# Patient Record
Sex: Male | Born: 1955
Health system: Southern US, Community
[De-identification: ages and names within clinical notes are randomized; demographics above are authoritative.]

## PROBLEM LIST (undated history)

## (undated) HISTORY — PX: TONSILLECTOMY: SUR1361

## (undated) HISTORY — PX: VASECTOMY: SHX75

## (undated) HISTORY — PX: OTHER SURGICAL HISTORY: SHX169

---

## 2006-08-29 ENCOUNTER — Encounter: Admission: RE | Admit: 2006-08-29 | Discharge: 2006-08-29 | Payer: Self-pay | Admitting: Family Medicine

## 2008-08-30 ENCOUNTER — Encounter: Admission: RE | Admit: 2008-08-30 | Discharge: 2008-08-30 | Payer: Self-pay | Admitting: General Surgery

## 2008-09-16 HISTORY — PX: COLONOSCOPY: SHX174

## 2008-09-16 LAB — HM COLONOSCOPY

## 2009-04-12 ENCOUNTER — Ambulatory Visit: Payer: Self-pay | Admitting: Family Medicine

## 2009-04-12 DIAGNOSIS — R32 Unspecified urinary incontinence: Secondary | ICD-10-CM | POA: Insufficient documentation

## 2010-01-10 ENCOUNTER — Ambulatory Visit: Payer: Self-pay | Admitting: Family Medicine

## 2010-01-10 LAB — CONVERTED CEMR LAB
Nitrite: NEGATIVE
Urobilinogen, UA: 0.2
WBC Urine, dipstick: NEGATIVE

## 2010-01-12 LAB — CONVERTED CEMR LAB
ALT: 13 units/L (ref 0–53)
Alkaline Phosphatase: 61 units/L (ref 39–117)
BUN: 24 mg/dL — ABNORMAL HIGH (ref 6–23)
Basophils Relative: 0.5 % (ref 0.0–3.0)
Calcium: 9.3 mg/dL (ref 8.4–10.5)
Cholesterol: 199 mg/dL (ref 0–200)
Creatinine, Ser: 0.8 mg/dL (ref 0.4–1.5)
Eosinophils Absolute: 0.2 10*3/uL (ref 0.0–0.7)
GFR calc non Af Amer: 106.96 mL/min (ref >60)
Glucose, Bld: 88 mg/dL (ref 70–99)
HCT: 42.1 % (ref 39.0–52.0)
HDL: 37.1 mg/dL — ABNORMAL LOW (ref >39.00)
LDL Cholesterol: 142 mg/dL — ABNORMAL HIGH (ref 0–99)
Lymphs Abs: 2.8 10*3/uL (ref 0.7–4.0)
MCHC: 34.5 g/dL (ref 30.0–36.0)
MCV: 86.1 fL (ref 78.0–100.0)
Monocytes Absolute: 0.7 10*3/uL (ref 0.1–1.0)
Neutro Abs: 5.3 10*3/uL (ref 1.4–7.7)
Potassium: 4.2 meq/L (ref 3.5–5.1)
RDW: 13.2 % (ref 11.5–14.6)
Total Protein: 7.5 g/dL (ref 6.0–8.3)
Triglycerides: 100 mg/dL (ref 0.0–149.0)
VLDL: 20 mg/dL (ref 0.0–40.0)
WBC: 9 10*3/uL (ref 4.5–10.5)

## 2010-01-17 ENCOUNTER — Ambulatory Visit: Payer: Self-pay | Admitting: Family Medicine

## 2010-10-18 NOTE — Assessment & Plan Note (Signed)
Summary: cpx/njr   Vital Signs:  Patient profile:   55 year old male Weight:      163 pounds BMI:     24.16 BP sitting:   124 / 64  (left arm) Cuff size:   regular  Vitals Entered By: Raechel Ache, RN (Jan 17, 2010 2:43 PM) CC: CPX, labs done. C/o LBP x 3 weeks and numbness fingers and toes.   History of Present Illness: 55 yr old male for a cpx. he feels good in general. About 4 weeks ago he twisted his lower back while helping to move a bathtub. he had stiffness and pain in the low back, but this  is going away. No radicular symptoms.   Preventive Screening-Counseling & Management  Alcohol-Tobacco     Smoking Status: current     Packs/Day: 1.0  Allergies: No Known Drug Allergies  Past History:  Past Medical History: Urinary incontinence sees Dr. Campbell Stall for Dermatology exams  Past Surgical History: Reviewed history from 04/12/2009 and no changes required. Tonsillectomy left inguinal hernia repair and umbilical hernia repair per Dr. Johna Sheriff  Vasectomy per Dr. Aldean Ast colonoscopy 2010 per Dr. Elnoria Howard, repeat in 10 yrs  Family History: Reviewed history from 04/12/2009 and no changes required. father died with polycystic kidney disease  Social History: Reviewed history from 04/12/2009 and no changes required. Single Current Smoker Alcohol use-no Drug use-yes Regular exercise-yes Packs/Day:  1.0  Review of Systems  The patient denies anorexia, fever, weight loss, weight gain, vision loss, decreased hearing, hoarseness, chest pain, syncope, dyspnea on exertion, peripheral edema, prolonged cough, headaches, hemoptysis, abdominal pain, melena, hematochezia, severe indigestion/heartburn, hematuria, incontinence, genital sores, muscle weakness, suspicious skin lesions, transient blindness, difficulty walking, depression, unusual weight change, abnormal bleeding, enlarged lymph nodes, angioedema, breast masses, and testicular masses.    Physical Exam  General:   Well-developed,well-nourished,in no acute distress; alert,appropriate and cooperative throughout examination Head:  Normocephalic and atraumatic without obvious abnormalities. No apparent alopecia or balding. Eyes:  No corneal or conjunctival inflammation noted. EOMI. Perrla. Funduscopic exam benign, without hemorrhages, exudates or papilledema. Vision grossly normal. Ears:  External ear exam shows no significant lesions or deformities.  Otoscopic examination reveals clear canals, tympanic membranes are intact bilaterally without bulging, retraction, inflammation or discharge. Hearing is grossly normal bilaterally. Nose:  External nasal examination shows no deformity or inflammation. Nasal mucosa are pink and moist without lesions or exudates. Mouth:  Oral mucosa and oropharynx without lesions or exudates.  Teeth in good repair. Neck:  No deformities, masses, or tenderness noted. Chest Wall:  No deformities, masses, tenderness or gynecomastia noted. Lungs:  Normal respiratory effort, chest expands symmetrically. Lungs are clear to auscultation, no crackles or wheezes. Heart:  Normal rate and regular rhythm. S1 and S2 normal without gallop, murmur, click, rub or other extra sounds. EKG normal Abdomen:  Bowel sounds positive,abdomen soft and non-tender without masses, organomegaly or hernias noted. Rectal:  No external abnormalities noted. Normal sphincter tone. No rectal masses or tenderness. Heme neg.  Genitalia:  Testes bilaterally descended without nodularity, tenderness or masses. No scrotal masses or lesions. No penis lesions or urethral discharge. Prostate:  Prostate gland firm and smooth, no enlargement, nodularity, tenderness, mass, asymmetry or induration. Msk:  No deformity or scoliosis noted of thoracic or lumbar spine.   Pulses:  R and L carotid,radial,femoral,dorsalis pedis and posterior tibial pulses are full and equal bilaterally Extremities:  No clubbing, cyanosis, edema, or deformity  noted with normal full range of motion of  all joints.   Neurologic:  No cranial nerve deficits noted. Station and gait are normal. Plantar reflexes are down-going bilaterally. DTRs are symmetrical throughout. Sensory, motor and coordinative functions appear intact. Skin:  Intact without suspicious lesions or rashes Cervical Nodes:  No lymphadenopathy noted Axillary Nodes:  No palpable lymphadenopathy Inguinal Nodes:  No significant adenopathy Psych:  Cognition and judgment appear intact. Alert and cooperative with normal attention span and concentration. No apparent delusions, illusions, hallucinations   Impression & Recommendations:  Problem # 1:  HEALTH MAINTENANCE EXAM (ICD-V70.0)  Orders: Hemoccult Guaiac-1 spec.(in office) (82270) EKG w/ Interpretation (93000)  Patient Instructions: 1)  Please schedule a follow-up appointment in 1 year.    Preventive Care Screening  Colonoscopy:    Date:  09/16/2008    Results:  normal

## 2010-12-17 ENCOUNTER — Ambulatory Visit (INDEPENDENT_AMBULATORY_CARE_PROVIDER_SITE_OTHER): Payer: PRIVATE HEALTH INSURANCE | Admitting: Family Medicine

## 2010-12-17 ENCOUNTER — Encounter: Payer: Self-pay | Admitting: Family Medicine

## 2010-12-17 VITALS — BP 110/70 | HR 82 | Temp 98.7°F

## 2010-12-17 DIAGNOSIS — S0083XA Contusion of other part of head, initial encounter: Secondary | ICD-10-CM

## 2010-12-17 DIAGNOSIS — R55 Syncope and collapse: Secondary | ICD-10-CM

## 2010-12-17 DIAGNOSIS — S1093XA Contusion of unspecified part of neck, initial encounter: Secondary | ICD-10-CM

## 2010-12-17 DIAGNOSIS — S0003XA Contusion of scalp, initial encounter: Secondary | ICD-10-CM

## 2010-12-18 ENCOUNTER — Encounter: Payer: Self-pay | Admitting: Family Medicine

## 2010-12-18 NOTE — Progress Notes (Signed)
Subjective:    Patient ID: Martin Bowman, male    DOB: 27-Apr-1956, 55 y.o.   MRN: [id number]  HPI Here with his wife to follow up an incident that happened on 01-13-11 [place]. That day he had not eaten any food all day, and he had had very little to drink all day. Then at 4:30 in the afternoon, he smoked a large [place] cigar. He does smoke cigarettes but he never smokes cigars. At one point he stood up and felt very lightheaded. He then passed out and fell forward, striking the floor with his face. This was witnessed by his friends. There was no shaking or clenching. Prior to this he felt fine, no HA or nausea or chest pain or SOB. After he fell he immediately came to again, and talked to his friends. He hit his right cheek and had swelling over the eye and cheek, as well as a few small lacerations on the cheek. He did not seek help. He got back home last night, and his wife had him apply ice to the area. Today he feels fine except for soreness over the cheek and some numbness over the right cheek and the right side of the upper lip.  His vision is normal, no HA, and no neurologic deficits. He has taken some Motrin.    Review of Systems  Constitutional: Negative.   HENT: Positive for facial swelling.   Eyes: Positive for redness. Negative for pain and visual disturbance.  Respiratory: Negative.   Cardiovascular: Negative.   Neurological: Negative.        Objective:   Physical Exam  Constitutional: He is oriented to person, place, and time. He appears well-developed and well-nourished.  HENT:  Nose: Nose normal.  Mouth/Throat: Oropharynx is clear and moist.       There is a lot of swelling over the right cheek, and he is tender here. He is mildly tender over the inferior orbital margin but this is intact. No crepitus. There are several small lacerations over the right cheek that have scabbed over. The cheek is ecchymotic.   Eyes: EOM are  normal. Pupils are equal, round, and reactive to light.       There is a small conjunctival hemorrhage in the right eye. Cornea is clear.   Neck: Normal range of motion. Neck supple.  Cardiovascular: Normal rate, regular rhythm, normal heart sounds and intact distal pulses.  Exam reveals no gallop and no friction rub.   No murmur heard. Pulmonary/Chest: Effort normal and breath sounds normal. No respiratory distress. He has no wheezes. He has no rales. He exhibits no tenderness.  Neurological: He is alert and oriented to person, place, and time. He displays normal reflexes. No cranial nerve deficit. He exhibits normal muscle tone. Coordination normal.          Assessment & Plan:  It seems he had a simple vasovagal episode from a combination of not eating all day, dehydration, and smoking a large cigar. He has had no neurologic symptoms since then. If he develops any such symptoms, he knows to see [place] again for further evaluation. He will apply ice to the cheek area. The lacerations should heal fine. There is no evidence of any fractures. The numbness is due to the swelling and also to some bruising of the inferior orbital nerve on the right side of the face. This should slowly resolve over the next few weeks.  If not he will return.

## 2011-01-23 ENCOUNTER — Other Ambulatory Visit: Payer: Self-pay | Admitting: Family Medicine

## 2011-01-23 ENCOUNTER — Other Ambulatory Visit (INDEPENDENT_AMBULATORY_CARE_PROVIDER_SITE_OTHER): Payer: PRIVATE HEALTH INSURANCE | Admitting: Family Medicine

## 2011-01-23 ENCOUNTER — Other Ambulatory Visit (INDEPENDENT_AMBULATORY_CARE_PROVIDER_SITE_OTHER): Payer: PRIVATE HEALTH INSURANCE

## 2011-01-23 DIAGNOSIS — Z13220 Encounter for screening for lipoid disorders: Secondary | ICD-10-CM

## 2011-01-23 DIAGNOSIS — Z0000 Encounter for general adult medical examination without abnormal findings: Secondary | ICD-10-CM

## 2011-01-23 LAB — CBC WITH DIFFERENTIAL/PLATELET
Eosinophils Absolute: 0.1 10*3/uL (ref 0.0–0.7)
MCHC: 34.2 g/dL (ref 30.0–36.0)
MCV: 87.5 fl (ref 78.0–100.0)
Monocytes Absolute: 0.7 10*3/uL (ref 0.1–1.0)
Neutrophils Relative %: 65.4 % (ref 43.0–77.0)
Platelets: 284 10*3/uL (ref 150.0–400.0)

## 2011-01-23 LAB — BASIC METABOLIC PANEL
BUN: 22 mg/dL (ref 6–23)
GFR: 99.35 mL/min (ref >60.00)
Potassium: 5.1 mEq/L (ref 3.5–5.1)

## 2011-01-23 LAB — URINALYSIS, ROUTINE W REFLEX MICROSCOPIC
Bilirubin Urine: NEGATIVE
Ketones, ur: NEGATIVE
Nitrite: NEGATIVE
Total Protein, Urine: NEGATIVE
pH: 7 (ref 5.0–8.0)

## 2011-01-23 LAB — LDL CHOLESTEROL, DIRECT: Direct LDL: 155.6 mg/dL

## 2011-01-23 LAB — TSH: TSH: 1.92 u[IU]/mL (ref 0.35–5.50)

## 2011-01-23 LAB — LIPID PANEL
Cholesterol: 208 mg/dL — ABNORMAL HIGH (ref 0–200)
Triglycerides: 60 mg/dL (ref 0.0–149.0)

## 2011-01-23 LAB — PSA: PSA: 0.88 ng/mL (ref 0.10–4.00)

## 2011-01-23 LAB — HEPATIC FUNCTION PANEL
Bilirubin, Direct: 0.1 mg/dL (ref 0.0–0.3)
Total Protein: 7 g/dL (ref 6.0–8.3)

## 2011-01-24 NOTE — Progress Notes (Signed)
LMOM to inform Pt. 

## 2011-01-30 ENCOUNTER — Ambulatory Visit (INDEPENDENT_AMBULATORY_CARE_PROVIDER_SITE_OTHER): Payer: PRIVATE HEALTH INSURANCE | Admitting: Family Medicine

## 2011-01-30 ENCOUNTER — Encounter: Payer: Self-pay | Admitting: Family Medicine

## 2011-01-30 VITALS — BP 104/68 | Temp 98.6°F | Ht 68.0 in | Wt 168.0 lb

## 2011-01-30 DIAGNOSIS — Z0000 Encounter for general adult medical examination without abnormal findings: Secondary | ICD-10-CM

## 2011-01-30 NOTE — Progress Notes (Signed)
  Subjective:    Patient ID: Martin Bowman, male    DOB: [birth year], 55 y.o.   MRN: [id number]  HPI 55 yr old male for a cpx. He feels well with no complaints.   Review of Systems  Constitutional: Negative.   HENT: Negative.   Eyes: Negative.   Respiratory: Negative.   Cardiovascular: Negative.   Gastrointestinal: Negative.   Genitourinary: Negative.   Musculoskeletal: Negative.   Skin: Negative.   Neurological: Negative.   Hematological: Negative.   Psychiatric/Behavioral: Negative.        Objective:   Physical Exam  Constitutional: He is oriented to person, place, and time. He appears well-developed and well-nourished. No distress.  HENT:  Head: Normocephalic and atraumatic.  Right Ear: External ear normal.  Left Ear: External ear normal.  Nose: Nose normal.  Mouth/Throat: Oropharynx is clear and moist. No oropharyngeal exudate.  Eyes: Conjunctivae and EOM are normal. Pupils are equal, round, and reactive to light. Right eye exhibits no discharge. Left eye exhibits no discharge. No scleral icterus.  Neck: Neck supple. No JVD present. No tracheal deviation present. No thyromegaly present.  Cardiovascular: Normal rate, regular rhythm, normal heart sounds and intact distal pulses.  Exam reveals no gallop and no friction rub.   No murmur heard. Pulmonary/Chest: Effort normal and breath sounds normal. No respiratory distress. He has no wheezes. He has no rales. He exhibits no tenderness.  Abdominal: Soft. Bowel sounds are normal. He exhibits no distension and no mass. There is no tenderness. There is no rebound and no guarding.  Genitourinary: Rectum normal, prostate normal and penis normal. Guaiac negative stool. No penile tenderness.  Musculoskeletal: Normal range of motion. He exhibits no edema and no tenderness.  Lymphadenopathy:    He has no cervical adenopathy.  Neurological: He is alert and oriented to person, place, and time. He has normal reflexes. No cranial nerve  deficit. He exhibits normal muscle tone. Coordination normal.  Skin: Skin is warm and dry. No [place] noted. He is not diaphoretic. No erythema. No pallor.  Psychiatric: He has a normal mood and affect. His behavior is normal. Judgment and thought content normal.          Assessment & Plan:  He is healthy.

## 2011-06-24 ENCOUNTER — Ambulatory Visit (INDEPENDENT_AMBULATORY_CARE_PROVIDER_SITE_OTHER): Payer: PRIVATE HEALTH INSURANCE | Admitting: Family Medicine

## 2011-06-24 ENCOUNTER — Encounter: Payer: Self-pay | Admitting: Family Medicine

## 2011-06-24 VITALS — BP 122/74 | HR 65 | Temp 98.1°F | Wt 164.0 lb

## 2011-06-24 DIAGNOSIS — M940 Chondrocostal junction syndrome [Tietze]: Secondary | ICD-10-CM

## 2011-06-25 ENCOUNTER — Encounter: Payer: Self-pay | Admitting: Family Medicine

## 2011-06-25 NOTE — Progress Notes (Signed)
  Subjective:    Patient ID: Martin Bowman, male    DOB: 1956/09/09, 55 y.o.   MRN: [id number]  HPI Here for several days of sharp pains in the left chest area. No SOB, but it hurts to take a deep breath. This is not related to exertion. No coughing. No heartburn or nausea or sweats.    Review of Systems  Constitutional: Negative.   Respiratory: Negative.   Cardiovascular: Positive for chest pain. Negative for palpitations and leg swelling.       Objective:   Physical Exam  Constitutional: He appears well-developed and well-nourished.  Neck: No thyromegaly present.  Cardiovascular: Normal rate, regular rhythm, normal heart sounds and intact distal pulses.  Exam reveals no gallop and no friction rub.   No murmur heard. Pulmonary/Chest: Effort normal and breath sounds normal. No respiratory distress. He has no wheezes. He has no rales.       He is tender over left sternal margin   Lymphadenopathy:    He has no cervical adenopathy.          Assessment & Plan:  Try Motrin prn. Recheck prn

## 2013-05-04 ENCOUNTER — Other Ambulatory Visit: Payer: PRIVATE HEALTH INSURANCE

## 2013-05-11 ENCOUNTER — Encounter: Payer: PRIVATE HEALTH INSURANCE | Admitting: Family Medicine

## 2013-06-02 ENCOUNTER — Other Ambulatory Visit (INDEPENDENT_AMBULATORY_CARE_PROVIDER_SITE_OTHER): Payer: BC Managed Care – PPO

## 2013-06-02 DIAGNOSIS — Z0000 Encounter for general adult medical examination without abnormal findings: Secondary | ICD-10-CM

## 2013-06-02 LAB — BASIC METABOLIC PANEL
CO2: 29 mEq/L (ref 19–32)
Calcium: 9.2 mg/dL (ref 8.4–10.5)
Chloride: 105 mEq/L (ref 96–112)
Glucose, Bld: 105 mg/dL — ABNORMAL HIGH (ref 70–99)
Sodium: 138 mEq/L (ref 135–145)

## 2013-06-02 LAB — LIPID PANEL
Cholesterol: 190 mg/dL (ref 0–200)
LDL Cholesterol: 142 mg/dL — ABNORMAL HIGH (ref 0–99)
Triglycerides: 35 mg/dL (ref 0.0–149.0)
VLDL: 7 mg/dL (ref 0.0–40.0)

## 2013-06-02 LAB — CBC WITH DIFFERENTIAL/PLATELET
Basophils Absolute: 0 10*3/uL (ref 0.0–0.1)
Eosinophils Absolute: 0.1 10*3/uL (ref 0.0–0.7)
HCT: 38.3 % — ABNORMAL LOW (ref 39.0–52.0)
Lymphs Abs: 2 10*3/uL (ref 0.7–4.0)
Monocytes Absolute: 0.4 10*3/uL (ref 0.1–1.0)
Neutro Abs: 4.5 10*3/uL (ref 1.4–7.7)
Platelets: 299 10*3/uL (ref 150.0–400.0)
RDW: 13 % (ref 11.5–14.6)
WBC: 7 10*3/uL (ref 4.5–10.5)

## 2013-06-02 LAB — TSH: TSH: 1.3 u[IU]/mL (ref 0.35–5.50)

## 2013-06-02 LAB — POCT URINALYSIS DIPSTICK
Bilirubin, UA: NEGATIVE
Glucose, UA: NEGATIVE
Ketones, UA: NEGATIVE
Spec Grav, UA: 1.025

## 2013-06-02 LAB — HEPATIC FUNCTION PANEL
ALT: 16 U/L (ref 0–53)
Bilirubin, Direct: 0.1 mg/dL (ref 0.0–0.3)
Total Bilirubin: 0.5 mg/dL (ref 0.3–1.2)

## 2013-06-04 NOTE — Progress Notes (Signed)
Quick Note:  Pt has appointment on 06/09/13 will go over then. ______ 

## 2013-06-09 ENCOUNTER — Encounter: Payer: Self-pay | Admitting: Family Medicine

## 2013-06-09 ENCOUNTER — Ambulatory Visit (INDEPENDENT_AMBULATORY_CARE_PROVIDER_SITE_OTHER): Payer: BC Managed Care – PPO | Admitting: Family Medicine

## 2013-06-09 VITALS — BP 136/74 | HR 78 | Temp 98.1°F | Ht 68.0 in | Wt 154.0 lb

## 2013-06-09 DIAGNOSIS — Z23 Encounter for immunization: Secondary | ICD-10-CM

## 2013-06-09 DIAGNOSIS — Z0000 Encounter for general adult medical examination without abnormal findings: Secondary | ICD-10-CM

## 2013-06-09 NOTE — Progress Notes (Signed)
  Subjective:    Patient ID: Martin Bowman, male    DOB: 03-16-1956, 57 y.o.   MRN: [id number]  HPI 57 yr old male for a cpx. He feels well. He is proud to say that he quit smoking 3 weeks ago!    Review of Systems  Constitutional: Negative.   HENT: Negative.   Eyes: Negative.   Respiratory: Negative.   Cardiovascular: Negative.   Gastrointestinal: Negative.   Genitourinary: Negative.   Musculoskeletal: Negative.   Skin: Negative.   Neurological: Negative.   Psychiatric/Behavioral: Negative.        Objective:   Physical Exam  Constitutional: He is oriented to person, place, and time. He appears well-developed and well-nourished. No distress.  HENT:  Head: Normocephalic and atraumatic.  Right Ear: External ear normal.  Left Ear: External ear normal.  Nose: Nose normal.  Mouth/Throat: Oropharynx is clear and moist. No oropharyngeal exudate.  Eyes: Conjunctivae and EOM are normal. Pupils are equal, round, and reactive to light. Right eye exhibits no discharge. Left eye exhibits no discharge. No scleral icterus.  Neck: Neck supple. No JVD present. No tracheal deviation present. No thyromegaly present.  Cardiovascular: Normal rate, regular rhythm, normal heart sounds and intact distal pulses.  Exam reveals no gallop and no friction rub.   No murmur heard. EKG normal   Pulmonary/Chest: Effort normal and breath sounds normal. No respiratory distress. He has no wheezes. He has no rales. He exhibits no tenderness.  Abdominal: Soft. Bowel sounds are normal. He exhibits no distension and no mass. There is no tenderness. There is no rebound and no guarding.  Genitourinary: Rectum normal, prostate normal and penis normal. Guaiac negative stool. No penile tenderness.  Musculoskeletal: Normal range of motion. He exhibits no edema and no tenderness.  Lymphadenopathy:    He has no cervical adenopathy.  Neurological: He is alert and oriented to person, place, and time. He has normal reflexes.  No cranial nerve deficit. He exhibits normal muscle tone. Coordination normal.  Skin: Skin is warm and dry. No Blake noted. He is not diaphoretic. No erythema. No pallor.  Psychiatric: He has a normal mood and affect. His behavior is normal. Judgment and thought content normal.          Assessment & Plan:  Well exam.

## 2014-03-28 ENCOUNTER — Ambulatory Visit (INDEPENDENT_AMBULATORY_CARE_PROVIDER_SITE_OTHER): Payer: BC Managed Care – PPO | Admitting: Family Medicine

## 2014-03-28 ENCOUNTER — Encounter: Payer: Self-pay | Admitting: Family Medicine

## 2014-03-28 VITALS — BP 118/68 | HR 78 | Temp 98.4°F | Ht 68.0 in | Wt 155.0 lb

## 2014-03-28 DIAGNOSIS — J018 Other acute sinusitis: Secondary | ICD-10-CM

## 2014-03-28 MED ORDER — CEFUROXIME AXETIL 500 MG PO TABS: 500.0000 mg | ORAL_TABLET | Freq: Two times a day (BID) | ORAL | Status: DC

## 2014-03-28 NOTE — Progress Notes (Signed)
   Subjective:    Patient ID: Martin Bowman, male    DOB: 1955-12-14, 58 y.o.   MRN: [id number]  HPI Here for 2 weeks of sinus pressure, PND, HA, and a dry cough. No fever. Also 2 days ago he developed a red spot under the right eye.    Review of Systems  Constitutional: Negative.   HENT: Positive for congestion, postnasal drip and sinus pressure.   Eyes: Negative.   Respiratory: Negative.        Objective:   Physical Exam  Constitutional: He appears well-developed and well-nourished.  HENT:  Right Ear: External ear normal.  Left Ear: External ear normal.  Nose: Nose normal.  Mouth/Throat: Oropharynx is clear and moist.  Eyes: Conjunctivae are normal.  Pulmonary/Chest: Effort normal and breath sounds normal.  Lymphadenopathy:    He has no cervical adenopathy.  Skin:  Red wheal on the right cheekbone          Assessment & Plan:  Use Ceftin and use an ice pack on the cheek.

## 2014-03-28 NOTE — Progress Notes (Signed)
Pre visit review using our clinic review tool, if applicable. No additional management support is needed unless otherwise documented below in the visit note. 

## 2014-06-06 ENCOUNTER — Other Ambulatory Visit (INDEPENDENT_AMBULATORY_CARE_PROVIDER_SITE_OTHER): Payer: BC Managed Care – PPO

## 2014-06-06 DIAGNOSIS — Z0000 Encounter for general adult medical examination without abnormal findings: Secondary | ICD-10-CM

## 2014-06-06 LAB — POCT URINALYSIS DIPSTICK
BILIRUBIN UA: NEGATIVE
GLUCOSE UA: NEGATIVE
Ketones, UA: NEGATIVE
Leukocytes, UA: NEGATIVE
Nitrite, UA: NEGATIVE
Protein, UA: NEGATIVE
RBC UA: NEGATIVE
SPEC GRAV UA: 1.015
UROBILINOGEN UA: 0.2
pH, UA: 7.5

## 2014-06-06 LAB — LIPID PANEL
CHOLESTEROL: 201 mg/dL — AB (ref 0–200)
HDL: 41.7 mg/dL (ref >39.00)
LDL CALC: 150 mg/dL — AB (ref 0–99)
NonHDL: 159.3
TRIGLYCERIDES: 45 mg/dL (ref 0.0–149.0)
Total CHOL/HDL Ratio: 5
VLDL: 9 mg/dL (ref 0.0–40.0)

## 2014-06-06 LAB — CBC WITH DIFFERENTIAL/PLATELET
Basophils Absolute: 0 10*3/uL (ref 0.0–0.1)
Basophils Relative: 0.3 % (ref 0.0–3.0)
EOS ABS: 0.1 10*3/uL (ref 0.0–0.7)
Eosinophils Relative: 1.3 % (ref 0.0–5.0)
HCT: 39 % (ref 39.0–52.0)
HEMOGLOBIN: 12.8 g/dL — AB (ref 13.0–17.0)
LYMPHS PCT: 29 % (ref 12.0–46.0)
Lymphs Abs: 2.3 10*3/uL (ref 0.7–4.0)
MCHC: 32.9 g/dL (ref 30.0–36.0)
MCV: 88.2 fl (ref 78.0–100.0)
MONOS PCT: 7 % (ref 3.0–12.0)
Monocytes Absolute: 0.6 10*3/uL (ref 0.1–1.0)
NEUTROS ABS: 5 10*3/uL (ref 1.4–7.7)
Neutrophils Relative %: 62.4 % (ref 43.0–77.0)
PLATELETS: 304 10*3/uL (ref 150.0–400.0)
RBC: 4.42 Mil/uL (ref 4.22–5.81)
RDW: 13.2 % (ref 11.5–15.5)
WBC: 8 10*3/uL (ref 4.0–10.5)

## 2014-06-06 LAB — BASIC METABOLIC PANEL
BUN: 22 mg/dL (ref 6–23)
CALCIUM: 9.4 mg/dL (ref 8.4–10.5)
CO2: 30 mEq/L (ref 19–32)
CREATININE: 0.7 mg/dL (ref 0.4–1.5)
Chloride: 107 mEq/L (ref 96–112)
GFR: 124.87 mL/min (ref >60.00)
Glucose, Bld: 122 mg/dL — ABNORMAL HIGH (ref 70–99)
Potassium: 4.6 mEq/L (ref 3.5–5.1)
SODIUM: 142 meq/L (ref 135–145)

## 2014-06-06 LAB — HEPATIC FUNCTION PANEL
ALBUMIN: 3.9 g/dL (ref 3.5–5.2)
ALK PHOS: 64 U/L (ref 39–117)
ALT: 17 U/L (ref 0–53)
AST: 17 U/L (ref 0–37)
Bilirubin, Direct: 0 mg/dL (ref 0.0–0.3)
TOTAL PROTEIN: 7.2 g/dL (ref 6.0–8.3)
Total Bilirubin: 0.3 mg/dL (ref 0.2–1.2)

## 2014-06-06 LAB — PSA: PSA: 1.19 ng/mL (ref 0.10–4.00)

## 2014-06-06 LAB — TSH: TSH: 1.41 u[IU]/mL (ref 0.35–4.50)

## 2014-06-13 ENCOUNTER — Ambulatory Visit (INDEPENDENT_AMBULATORY_CARE_PROVIDER_SITE_OTHER): Payer: BC Managed Care – PPO | Admitting: Family Medicine

## 2014-06-13 ENCOUNTER — Encounter: Payer: Self-pay | Admitting: Family Medicine

## 2014-06-13 VITALS — BP 135/76 | HR 80 | Temp 98.6°F | Ht 68.0 in | Wt 160.0 lb

## 2014-06-13 DIAGNOSIS — Z0000 Encounter for general adult medical examination without abnormal findings: Secondary | ICD-10-CM

## 2014-06-13 DIAGNOSIS — Z23 Encounter for immunization: Secondary | ICD-10-CM

## 2014-06-13 NOTE — Progress Notes (Signed)
   Subjective:    Patient ID: Martin Bowman, male    DOB: 1956-06-03, 58 y.o.   MRN: [id number]  HPI 58 yr old male for a cpx. He feels well.    Review of Systems  Constitutional: Negative.   HENT: Negative.   Eyes: Negative.   Respiratory: Negative.   Cardiovascular: Negative.   Gastrointestinal: Negative.   Genitourinary: Negative.   Musculoskeletal: Negative.   Skin: Negative.   Neurological: Negative.   Psychiatric/Behavioral: Negative.        Objective:   Physical Exam  Constitutional: He is oriented to person, place, and time. He appears well-developed and well-nourished. No distress.  HENT:  Head: Normocephalic and atraumatic.  Right Ear: External ear normal.  Left Ear: External ear normal.  Nose: Nose normal.  Mouth/Throat: Oropharynx is clear and moist. No oropharyngeal exudate.  Eyes: Conjunctivae and EOM are normal. Pupils are equal, round, and reactive to light. Right eye exhibits no discharge. Left eye exhibits no discharge. No scleral icterus.  Neck: Neck supple. No JVD present. No tracheal deviation present. No thyromegaly present.  Cardiovascular: Normal rate, regular rhythm, normal heart sounds and intact distal pulses.  Exam reveals no gallop and no friction rub.   No murmur heard. EKG normal   Pulmonary/Chest: Effort normal and breath sounds normal. No respiratory distress. He has no wheezes. He has no rales. He exhibits no tenderness.  Abdominal: Soft. Bowel sounds are normal. He exhibits no distension and no mass. There is no tenderness. There is no rebound and no guarding.  Genitourinary: Rectum normal, prostate normal and penis normal. Guaiac negative stool. No penile tenderness.  Musculoskeletal: Normal range of motion. He exhibits no edema and no tenderness.  Lymphadenopathy:    He has no cervical adenopathy.  Neurological: He is alert and oriented to person, place, and time. He has normal reflexes. No cranial nerve deficit. He exhibits normal  muscle tone. Coordination normal.  Skin: Skin is warm and dry. No Mackel noted. He is not diaphoretic. No erythema. No pallor.  Psychiatric: He has a normal mood and affect. His behavior is normal. Judgment and thought content normal.          Assessment & Plan:  Well exam.

## 2014-06-13 NOTE — Progress Notes (Signed)
Pre visit review using our clinic review tool, if applicable. No additional management support is needed unless otherwise documented below in the visit note. 

## 2015-03-29 ENCOUNTER — Telehealth: Payer: Self-pay | Admitting: Family Medicine

## 2015-03-29 ENCOUNTER — Ambulatory Visit (INDEPENDENT_AMBULATORY_CARE_PROVIDER_SITE_OTHER): Payer: BLUE CROSS/BLUE SHIELD | Admitting: Family Medicine

## 2015-03-29 ENCOUNTER — Encounter: Payer: Self-pay | Admitting: Family Medicine

## 2015-03-29 VITALS — BP 126/67 | HR 69 | Temp 98.6°F | Ht 68.0 in | Wt 150.0 lb

## 2015-03-29 DIAGNOSIS — M25562 Pain in left knee: Secondary | ICD-10-CM

## 2015-03-29 DIAGNOSIS — R739 Hyperglycemia, unspecified: Secondary | ICD-10-CM | POA: Diagnosis not present

## 2015-03-29 DIAGNOSIS — R634 Abnormal weight loss: Secondary | ICD-10-CM | POA: Diagnosis not present

## 2015-03-29 MED ORDER — DICLOFENAC SODIUM 75 MG PO TBEC: 75.0000 mg | DELAYED_RELEASE_TABLET | Freq: Two times a day (BID) | ORAL | Status: DC

## 2015-03-29 NOTE — Progress Notes (Signed)
Pre visit review using our clinic review tool, if applicable. No additional management support is needed unless otherwise documented below in the visit note. 

## 2015-03-29 NOTE — Telephone Encounter (Signed)
I spoke with pt and he needs to schedule a lab appointment for Friday at 3:45 pm, he is coming in to have a A1c drawn.

## 2015-03-29 NOTE — Progress Notes (Signed)
   Subjective:    Patient ID: Martin Bowman, male    DOB: February 29, 1956, 59 y.o.   MRN: [id number]  HPI Here for 2 reasons. First he has had intermittent pain and swelling in the left knee for the past 2 weeks. No recent trauma. Advil does not help. No locking or giving way. Second he is concerned about weight loss. He is not trying to lose weight and his food intake is the same as always. However he has lost about 10 lbs in the past few months. He had a normal exam last September with a normal PSA. He had an unremarkable colonoscopy a few years ago. He did have an elevated fasting glucose in Septeber however and we agreed to watch this closely.   Review of Systems  Constitutional: Positive for unexpected weight change. Negative for fever, chills, diaphoresis, activity change, appetite change and fatigue.  HENT: Negative.   Respiratory: Negative.   Cardiovascular: Negative.   Gastrointestinal: Negative.   Endocrine: Negative.   Genitourinary: Negative.        Objective:   Physical Exam  Constitutional: He appears well-developed and well-nourished. No distress.  Cardiovascular: Normal rate, regular rhythm, normal heart sounds and intact distal pulses.   Pulmonary/Chest: Effort normal and breath sounds normal.  Abdominal: Soft. Bowel sounds are normal. He exhibits no distension and no mass. There is no tenderness. There is no rebound and no guarding.  Musculoskeletal:  The left knee is tender along the medial joint space with no swelling or crepitus. No locking or giving way. Full ROM          Assessment & Plan:  Knee pain, possibly due to early arthritis. Try heat and Diclofenac 75 mg bid prn. Two potential sources of weight loss would be lung cancer and diabetes. We will set him up for an A1c soon , and also a CXR. Encouraged him to stop smoking again.

## 2015-03-29 NOTE — Telephone Encounter (Signed)
done

## 2015-03-30 ENCOUNTER — Other Ambulatory Visit: Payer: BLUE CROSS/BLUE SHIELD

## 2015-03-30 ENCOUNTER — Ambulatory Visit (INDEPENDENT_AMBULATORY_CARE_PROVIDER_SITE_OTHER)
Admission: RE | Admit: 2015-03-30 | Discharge: 2015-03-30 | Disposition: A | Discharge status: Home / Self Care | Payer: BLUE CROSS/BLUE SHIELD | Source: Ambulatory Visit | Attending: Family Medicine | Admitting: Family Medicine

## 2015-03-30 DIAGNOSIS — R634 Abnormal weight loss: Secondary | ICD-10-CM | POA: Diagnosis not present

## 2015-03-30 NOTE — Addendum Note (Signed)
Addended by: Bonnye FavaKWEI, Katrena Stehlin K on: 03/30/2015 03:56 PM   Modules accepted: Orders

## 2015-03-31 ENCOUNTER — Other Ambulatory Visit: Payer: BLUE CROSS/BLUE SHIELD

## 2015-03-31 LAB — HEMOGLOBIN A1C: Hgb A1c MFr Bld: 5.8 % (ref 4.6–6.5)

## 2015-04-03 ENCOUNTER — Telehealth: Payer: Self-pay | Admitting: Family Medicine

## 2015-04-03 NOTE — Telephone Encounter (Signed)
I spoke with pt and went over results. 

## 2015-04-03 NOTE — Telephone Encounter (Signed)
Patient would like to know the results to his labs and x-ray.

## 2015-12-27 ENCOUNTER — Ambulatory Visit (INDEPENDENT_AMBULATORY_CARE_PROVIDER_SITE_OTHER): Payer: BLUE CROSS/BLUE SHIELD | Admitting: Family Medicine

## 2015-12-27 DIAGNOSIS — R21 Rash and other nonspecific skin eruption: Secondary | ICD-10-CM | POA: Diagnosis not present

## 2015-12-27 MED ORDER — TRIAMCINOLONE ACETONIDE 0.1 % EX CREA: 1.0000 "application " | TOPICAL_CREAM | Freq: Two times a day (BID) | CUTANEOUS | Status: DC | PRN

## 2015-12-27 MED ORDER — PREDNISONE 10 MG PO TABS: ORAL_TABLET | ORAL | Status: DC

## 2015-12-27 NOTE — Patient Instructions (Signed)
Try to avoid scratching [place] as much as possible.   Touch base in 2 weeks if no better.

## 2015-12-27 NOTE — Progress Notes (Signed)
   Subjective:    Patient ID: Martin Bowman, male    DOB: Feb 23, 1956, 60 y.o.   MRN: [id number]  HPI  Acute visit for Falzon  upper back  onset about 6 weeks ago.  Patient was taking high-dose B12 supplement with 3000 g once daily.  He was concerned this may have triggered.  Denies any other skin [place]. His Raglin is very symmetric and pruritic.  Bilateral involvement. Nonpainful.  Tried over-the-counter Neosporin without improvement.  Past Medical History  Diagnosis Date  . Urinary incontinence    Past Surgical History  Procedure Laterality Date  . Tonsillectomy    . Left inguinal hernia repair and umbilical hernia repiar      Dr. Johna Sheriffhoxworth  . Vasectomy      Dr. Aldean AstKimbrough  . Colonoscopy  09-16-08    not sure who (maybe Dr. Elnoria HowardHung?), clear, repeat in 10 yrs     reports that he has been smoking Cigarettes.  He has a 40 pack-year smoking history. He has never used smokeless tobacco. He reports that he does not drink alcohol or use illicit drugs. family history is not on file. No Known Allergies    Review of Systems  Constitutional: Negative for fever and chills.  Skin: Positive for [place].  Hematological: Negative for adenopathy.       Objective:   Physical Exam  Constitutional: He appears well-developed and well-nourished.  Cardiovascular: Normal rate and regular rhythm.   Pulmonary/Chest: Effort normal and breath sounds normal. No respiratory distress. He has no wheezes. He has no rales.  Skin: [place] noted.  Patient has diffuse Koe[place]ne across his upper back very symmetric. He has erythematous base -somewhat lichenified and excoriated in several places. No pustules. No vesicles. Nontender.          Assessment & Plan:   Lichenified and excoriated skin Skeet upper back. Etiology unclear. Avoid scratching. Prednisone taper over the next 8 days. Triamcinolone 0.1% cream twice daily. Follow-up with primary in 2 weeks if not improving. He has discontinued the B12. Consider course of  doxycycline if not clearing with the above, though he has no pustular quality at this time

## 2015-12-27 NOTE — Progress Notes (Signed)
Pre visit review using our clinic review tool, if applicable. No additional management support is needed unless otherwise documented below in the visit note. 

## 2016-01-20 IMAGING — CR DG CHEST 2V
2 series · 2 of 2 positions shown · non-contrast
Comparison: None.

CLINICAL DATA: 15 lb weight loss.  Smoker.

EXAM:
CHEST  2 VIEW

[view not recorded (1 of 2)]
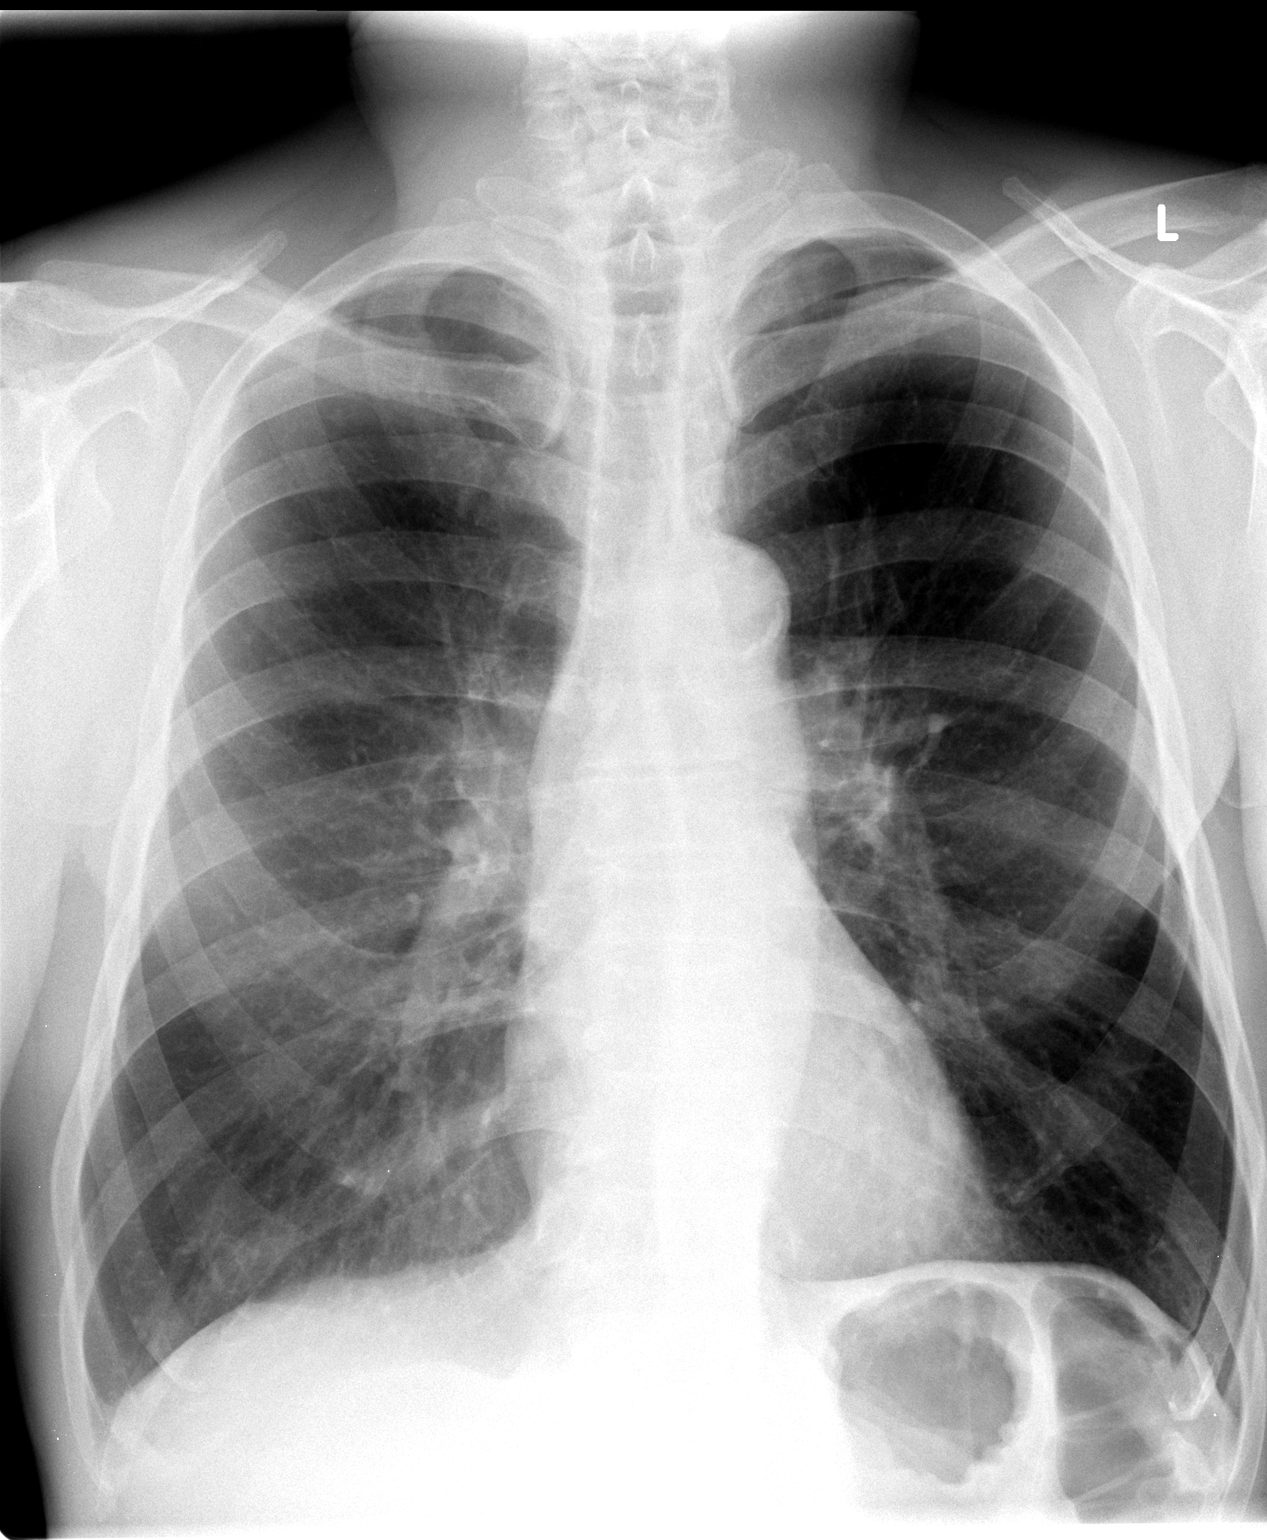

[view not recorded (2 of 2)]
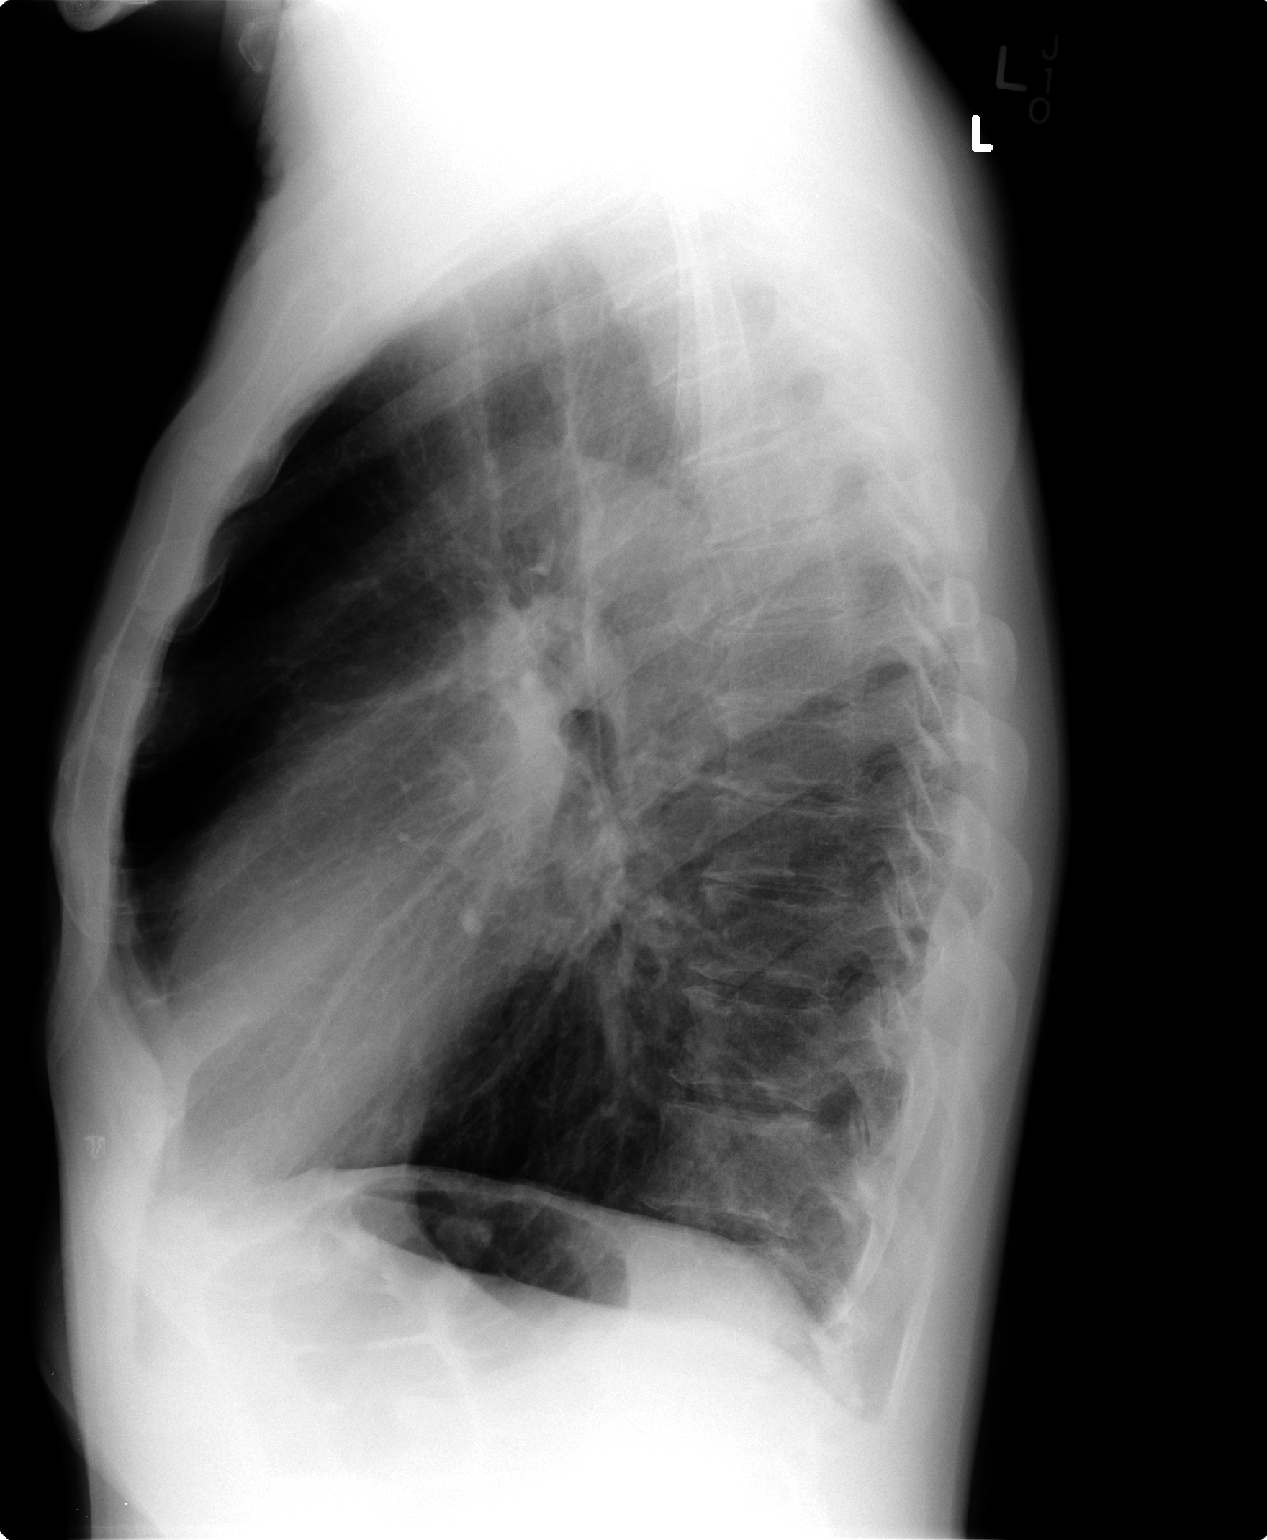

[2 of 2 positions shown; findings below may reference images not displayed]

FINDINGS: Mediastinum and hilar structures are normal. Mild right lower lobe
infiltrate cannot be excluded. Heart size normal. No pleural
effusion or pneumothorax. No acute bony abnormality.
IMPRESSION: Mild right lower lobe infiltrate cannot be excluded.

## 2016-06-05 ENCOUNTER — Ambulatory Visit (INDEPENDENT_AMBULATORY_CARE_PROVIDER_SITE_OTHER): Payer: BLUE CROSS/BLUE SHIELD | Admitting: Family Medicine

## 2016-06-05 ENCOUNTER — Encounter: Payer: Self-pay | Admitting: Family Medicine

## 2016-06-05 VITALS — BP 132/68 | HR 78 | Temp 98.1°F | Ht 68.0 in | Wt 152.0 lb

## 2016-06-05 DIAGNOSIS — R739 Hyperglycemia, unspecified: Secondary | ICD-10-CM

## 2016-06-05 DIAGNOSIS — L989 Disorder of the skin and subcutaneous tissue, unspecified: Secondary | ICD-10-CM

## 2016-06-05 DIAGNOSIS — H6982 Other specified disorders of Eustachian tube, left ear: Secondary | ICD-10-CM | POA: Diagnosis not present

## 2016-06-05 DIAGNOSIS — Z209 Contact with and (suspected) exposure to unspecified communicable disease: Secondary | ICD-10-CM | POA: Diagnosis not present

## 2016-06-05 DIAGNOSIS — R202 Paresthesia of skin: Secondary | ICD-10-CM

## 2016-06-05 NOTE — Progress Notes (Signed)
   Subjective:    Patient ID: Martin Bowman, male    DOB: April 19, 1956,Jana Half 60 y.o.   MRN: [id number]  HPI Here for several issues. First for 2 weeks he has had muffled hearing and pain in the left ear. No sinus pressure or ST or fever. Also for 2 months he has had an irritated lesion over the upper chest. This has not changed in appearance. Finally he mentions several months of intermittent numbness and tingling in both hands and fingers, not the feet. Of note he had an elevated fasting glucose of 122 last year with a normal A1c.    Review of Systems  Constitutional: Negative.   HENT: Positive for ear pain, hearing loss and sneezing. Negative for congestion, postnasal drip, sinus pressure and sore throat.   Eyes: Negative.   Respiratory: Negative.   Skin: Positive for color change.  Neurological: Positive for numbness. Negative for weakness.       Objective:   Physical Exam  Constitutional: He appears well-developed and well-nourished.  HENT:  Right Ear: External ear normal.  Left Ear: External ear normal.  Nose: Nose normal.  Mouth/Throat: Oropharynx is clear and moist.  Eyes: Conjunctivae are normal.  Neck: No thyromegaly present.  Cardiovascular: Normal rate, regular rhythm, normal heart sounds and intact distal pulses.   Pulmonary/Chest: Effort normal and breath sounds normal.  Lymphadenopathy:    He has no cervical adenopathy.  Skin:  The upper sternal area has a papular red scaly lesion           Assessment & Plan:  For the eustachian tube dysfunction, try Zyrtec D. The numbness in his hands may be due to  Neuropathy so we will add an A1c to his cpx labs next month. For the skin lesion we will refer him to Dr. Danella DeisGruber, his dermatologist.  Nelwyn SalisburyFRY,Meghann Landing A, MD

## 2016-06-05 NOTE — Progress Notes (Signed)
Pre visit review using our clinic review tool, if applicable. No additional management support is needed unless otherwise documented below in the visit note. 

## 2016-06-27 DIAGNOSIS — Z23 Encounter for immunization: Secondary | ICD-10-CM | POA: Diagnosis not present

## 2016-07-01 DIAGNOSIS — C44519 Basal cell carcinoma of skin of other part of trunk: Secondary | ICD-10-CM | POA: Diagnosis not present

## 2016-07-01 DIAGNOSIS — L570 Actinic keratosis: Secondary | ICD-10-CM | POA: Diagnosis not present

## 2016-07-08 ENCOUNTER — Other Ambulatory Visit (INDEPENDENT_AMBULATORY_CARE_PROVIDER_SITE_OTHER): Payer: BLUE CROSS/BLUE SHIELD

## 2016-07-08 DIAGNOSIS — R739 Hyperglycemia, unspecified: Secondary | ICD-10-CM | POA: Diagnosis not present

## 2016-07-08 DIAGNOSIS — Z209 Contact with and (suspected) exposure to unspecified communicable disease: Secondary | ICD-10-CM | POA: Diagnosis not present

## 2016-07-08 DIAGNOSIS — Z0000 Encounter for general adult medical examination without abnormal findings: Secondary | ICD-10-CM

## 2016-07-08 LAB — POC URINALSYSI DIPSTICK (AUTOMATED)
Bilirubin, UA: NEGATIVE
Glucose, UA: NEGATIVE
Ketones, UA: NEGATIVE
Leukocytes, UA: NEGATIVE
Nitrite, UA: NEGATIVE
PROTEIN UA: NEGATIVE
SPEC GRAV UA: 1.015
UROBILINOGEN UA: 0.2
pH, UA: 6

## 2016-07-08 LAB — BASIC METABOLIC PANEL
BUN: 22 mg/dL (ref 6–23)
CALCIUM: 9.2 mg/dL (ref 8.4–10.5)
CO2: 28 meq/L (ref 19–32)
Chloride: 106 mEq/L (ref 96–112)
Creatinine, Ser: 0.8 mg/dL (ref 0.40–1.50)
GFR: 104.53 mL/min (ref >60.00)
GLUCOSE: 105 mg/dL — AB (ref 70–99)
Potassium: 4.7 mEq/L (ref 3.5–5.1)
Sodium: 140 mEq/L (ref 135–145)

## 2016-07-08 LAB — LIPID PANEL
CHOL/HDL RATIO: 5
Cholesterol: 194 mg/dL (ref 0–200)
HDL: 41.2 mg/dL (ref >39.00)
LDL Cholesterol: 144 mg/dL — ABNORMAL HIGH (ref 0–99)
NONHDL: 153.12
Triglycerides: 46 mg/dL (ref 0.0–149.0)
VLDL: 9.2 mg/dL (ref 0.0–40.0)

## 2016-07-08 LAB — CBC WITH DIFFERENTIAL/PLATELET
BASOS ABS: 0 10*3/uL (ref 0.0–0.1)
BASOS PCT: 0.3 % (ref 0.0–3.0)
EOS ABS: 0.1 10*3/uL (ref 0.0–0.7)
Eosinophils Relative: 0.9 % (ref 0.0–5.0)
HEMATOCRIT: 38.5 % — AB (ref 39.0–52.0)
Hemoglobin: 12.9 g/dL — ABNORMAL LOW (ref 13.0–17.0)
Lymphocytes Relative: 21.6 % (ref 12.0–46.0)
Lymphs Abs: 1.9 10*3/uL (ref 0.7–4.0)
MCHC: 33.4 g/dL (ref 30.0–36.0)
MCV: 86.1 fl (ref 78.0–100.0)
Monocytes Absolute: 0.7 10*3/uL (ref 0.1–1.0)
Monocytes Relative: 7.4 % (ref 3.0–12.0)
Neutro Abs: 6.2 10*3/uL (ref 1.4–7.7)
Neutrophils Relative %: 69.8 % (ref 43.0–77.0)
Platelets: 320 10*3/uL (ref 150.0–400.0)
RBC: 4.47 Mil/uL (ref 4.22–5.81)
RDW: 13.5 % (ref 11.5–15.5)
WBC: 8.9 10*3/uL (ref 4.0–10.5)

## 2016-07-08 LAB — HEPATIC FUNCTION PANEL
ALK PHOS: 62 U/L (ref 39–117)
ALT: 11 U/L (ref 0–53)
AST: 12 U/L (ref 0–37)
Albumin: 4.2 g/dL (ref 3.5–5.2)
BILIRUBIN DIRECT: 0.1 mg/dL (ref 0.0–0.3)
TOTAL PROTEIN: 6.9 g/dL (ref 6.0–8.3)
Total Bilirubin: 0.3 mg/dL (ref 0.2–1.2)

## 2016-07-08 LAB — HEPATITIS C ANTIBODY: HCV Ab: NEGATIVE

## 2016-07-08 LAB — PSA: PSA: 0.7 ng/mL (ref 0.10–4.00)

## 2016-07-08 LAB — HEMOGLOBIN A1C: Hgb A1c MFr Bld: 5.7 % (ref 4.6–6.5)

## 2016-07-08 LAB — TSH: TSH: 1.53 u[IU]/mL (ref 0.35–4.50)

## 2016-07-15 ENCOUNTER — Ambulatory Visit (INDEPENDENT_AMBULATORY_CARE_PROVIDER_SITE_OTHER): Payer: BLUE CROSS/BLUE SHIELD | Admitting: Family Medicine

## 2016-07-15 ENCOUNTER — Encounter: Payer: Self-pay | Admitting: Family Medicine

## 2016-07-15 VITALS — BP 145/75 | HR 89 | Temp 98.1°F | Ht 68.0 in | Wt 156.0 lb

## 2016-07-15 DIAGNOSIS — Z0000 Encounter for general adult medical examination without abnormal findings: Secondary | ICD-10-CM

## 2016-07-15 NOTE — Progress Notes (Signed)
   Subjective:    Patient ID: Martin Bowman, male    DOB: February 26, 1956, 60 y.o.   MRN: [id number]  HPI 60 yr old male for a well exam. He feels fine.    Review of Systems  Constitutional: Negative.   HENT: Negative.   Eyes: Negative.   Respiratory: Negative.   Cardiovascular: Negative.   Gastrointestinal: Negative.   Genitourinary: Negative.   Musculoskeletal: Negative.   Skin: Negative.   Neurological: Negative.   Psychiatric/Behavioral: Negative.        Objective:   Physical Exam  Constitutional: He is oriented to person, place, and time. He appears well-developed and well-nourished. No distress.  HENT:  Head: Normocephalic and atraumatic.  Right Ear: External ear normal.  Left Ear: External ear normal.  Nose: Nose normal.  Mouth/Throat: Oropharynx is clear and moist. No oropharyngeal exudate.  Eyes: Conjunctivae and EOM are normal. Pupils are equal, round, and reactive to light. Right eye exhibits no discharge. Left eye exhibits no discharge. No scleral icterus.  Neck: Neck supple. No JVD present. No tracheal deviation present. No thyromegaly present.  Cardiovascular: Normal rate, regular rhythm, normal heart sounds and intact distal pulses.  Exam reveals no gallop and no friction rub.   No murmur heard. EKG shows stable LAFB  Pulmonary/Chest: Effort normal and breath sounds normal. No respiratory distress. He has no wheezes. He has no rales. He exhibits no tenderness.  Abdominal: Soft. Bowel sounds are normal. He exhibits no distension and no mass. There is no tenderness. There is no rebound and no guarding.  Genitourinary: Rectum normal, prostate normal and penis normal. Rectal exam shows guaiac negative stool. No penile tenderness.  Musculoskeletal: Normal range of motion. He exhibits no edema or tenderness.  Lymphadenopathy:    He has no cervical adenopathy.  Neurological: He is alert and oriented to person, place, and time. He has normal reflexes. No cranial nerve  deficit. He exhibits normal muscle tone. Coordination normal.  Skin: Skin is warm and dry. No Cardon noted. He is not diaphoretic. No erythema. No pallor.  Psychiatric: He has a normal mood and affect. His behavior is normal. Judgment and thought content normal.          Assessment & Plan:  Well exam. We discussed diet and exercise.  Nelwyn SalisburyFRY,[name] A, MD

## 2016-07-15 NOTE — Progress Notes (Signed)
Pre visit review using our clinic review tool, if applicable. No additional management support is needed unless otherwise documented below in the visit note. 

## 2017-01-06 DIAGNOSIS — L814 Other melanin hyperpigmentation: Secondary | ICD-10-CM | POA: Diagnosis not present

## 2017-01-06 DIAGNOSIS — D1801 Hemangioma of skin and subcutaneous tissue: Secondary | ICD-10-CM | POA: Diagnosis not present

## 2017-01-06 DIAGNOSIS — L821 Other seborrheic keratosis: Secondary | ICD-10-CM | POA: Diagnosis not present

## 2017-01-06 DIAGNOSIS — Z86018 Personal history of other benign neoplasm: Secondary | ICD-10-CM | POA: Diagnosis not present

## 2017-02-07 ENCOUNTER — Ambulatory Visit (INDEPENDENT_AMBULATORY_CARE_PROVIDER_SITE_OTHER): Payer: BLUE CROSS/BLUE SHIELD | Admitting: Family Medicine

## 2017-02-07 VITALS — BP 155/80 | HR 75 | Temp 98.4°F | Ht 68.0 in | Wt 157.0 lb

## 2017-02-07 DIAGNOSIS — M7022 Olecranon bursitis, left elbow: Secondary | ICD-10-CM | POA: Diagnosis not present

## 2017-02-07 NOTE — Patient Instructions (Signed)
WE NOW OFFER   [facility] FAST TRACK!!!  SAME DAY Appointments for ACUTE CARE  Such as: Sprains, Injuries, cuts, abrasions, rashes, muscle pain, joint pain, back pain Colds, flu, sore throats, headache, allergies, cough, fever  Ear pain, sinus and eye infections Abdominal pain, nausea, vomiting, diarrhea, upset stomach Animal/insect bites  3 Easy Ways to Schedule: Walk-In Scheduling Call in scheduling Mychart Sign-up: [email or web address]         

## 2017-02-10 ENCOUNTER — Encounter: Payer: Self-pay | Admitting: Family Medicine

## 2017-02-10 NOTE — Progress Notes (Signed)
   Subjective:    Patient ID: Martin Bowman, male    DOB: 16-Apr-1956, 61 y.o.   MRN: [id number]  HPI Here for 2 days of swelling in the left elbow. No pain, no recent trauma.   Review of Systems  Constitutional: Negative.   Musculoskeletal: Positive for arthralgias and joint swelling.       Objective:   Physical Exam  Constitutional: He appears well-developed and well-nourished.  Cardiovascular: Normal rate, regular rhythm, normal heart sounds and intact distal pulses.   Pulmonary/Chest: Effort normal and breath sounds normal.  Musculoskeletal:  The left olecranon bursa is swollen but not red or warm or tender          Assessment & Plan:  Olecranon bursitis, treat with rest, ice packs, and an ACE wrap.  Gershon CraneStephen Fronia Depass, MD

## 2017-08-06 ENCOUNTER — Ambulatory Visit (INDEPENDENT_AMBULATORY_CARE_PROVIDER_SITE_OTHER): Payer: BLUE CROSS/BLUE SHIELD | Admitting: Family Medicine

## 2017-08-06 ENCOUNTER — Encounter: Payer: Self-pay | Admitting: Family Medicine

## 2017-08-06 VITALS — BP 122/80 | HR 65 | Temp 97.8°F | Ht 68.0 in | Wt 149.8 lb

## 2017-08-06 DIAGNOSIS — Z0000 Encounter for general adult medical examination without abnormal findings: Secondary | ICD-10-CM | POA: Diagnosis not present

## 2017-08-06 DIAGNOSIS — Z23 Encounter for immunization: Secondary | ICD-10-CM

## 2017-08-06 LAB — CBC WITH DIFFERENTIAL/PLATELET
BASOS PCT: 0.3 % (ref 0.0–3.0)
Basophils Absolute: 0 10*3/uL (ref 0.0–0.1)
EOS PCT: 1.1 % (ref 0.0–5.0)
Eosinophils Absolute: 0.1 10*3/uL (ref 0.0–0.7)
HEMATOCRIT: 41.7 % (ref 39.0–52.0)
HEMOGLOBIN: 13.8 g/dL (ref 13.0–17.0)
LYMPHS PCT: 22.3 % (ref 12.0–46.0)
Lymphs Abs: 1.6 10*3/uL (ref 0.7–4.0)
MCHC: 33 g/dL (ref 30.0–36.0)
MCV: 87.5 fl (ref 78.0–100.0)
Monocytes Absolute: 0.5 10*3/uL (ref 0.1–1.0)
Monocytes Relative: 7.6 % (ref 3.0–12.0)
Neutro Abs: 4.9 10*3/uL (ref 1.4–7.7)
Neutrophils Relative %: 68.7 % (ref 43.0–77.0)
Platelets: 299 10*3/uL (ref 150.0–400.0)
RBC: 4.77 Mil/uL (ref 4.22–5.81)
RDW: 12.9 % (ref 11.5–15.5)
WBC: 7.1 10*3/uL (ref 4.0–10.5)

## 2017-08-06 LAB — POC URINALSYSI DIPSTICK (AUTOMATED)
Bilirubin, UA: NEGATIVE
Glucose, UA: NEGATIVE
Ketones, UA: NEGATIVE
Leukocytes, UA: NEGATIVE
NITRITE UA: NEGATIVE
PH UA: 6 (ref 5.0–8.0)
Protein, UA: NEGATIVE
Urobilinogen, UA: 0.2 E.U./dL

## 2017-08-06 LAB — LIPID PANEL
Cholesterol: 194 mg/dL (ref 0–200)
HDL: 33.8 mg/dL — ABNORMAL LOW (ref >39.00)
LDL CALC: 149 mg/dL — AB (ref 0–99)
NonHDL: 160.63
TRIGLYCERIDES: 56 mg/dL (ref 0.0–149.0)
Total CHOL/HDL Ratio: 6
VLDL: 11.2 mg/dL (ref 0.0–40.0)

## 2017-08-06 LAB — TSH: TSH: 0.74 u[IU]/mL (ref 0.35–4.50)

## 2017-08-06 LAB — HEPATIC FUNCTION PANEL
ALT: 11 U/L (ref 0–53)
AST: 14 U/L (ref 0–37)
Albumin: 4.2 g/dL (ref 3.5–5.2)
Alkaline Phosphatase: 66 U/L (ref 39–117)
Bilirubin, Direct: 0.1 mg/dL (ref 0.0–0.3)
Total Bilirubin: 0.5 mg/dL (ref 0.2–1.2)
Total Protein: 6.8 g/dL (ref 6.0–8.3)

## 2017-08-06 LAB — BASIC METABOLIC PANEL
BUN: 24 mg/dL — ABNORMAL HIGH (ref 6–23)
CALCIUM: 9.3 mg/dL (ref 8.4–10.5)
CO2: 30 mEq/L (ref 19–32)
Chloride: 106 mEq/L (ref 96–112)
Creatinine, Ser: 0.7 mg/dL (ref 0.40–1.50)
GFR: 121.51 mL/min (ref >60.00)
Glucose, Bld: 100 mg/dL — ABNORMAL HIGH (ref 70–99)
POTASSIUM: 4.9 meq/L (ref 3.5–5.1)
Sodium: 141 mEq/L (ref 135–145)

## 2017-08-06 LAB — PSA: PSA: 0.83 ng/mL (ref 0.10–4.00)

## 2017-08-06 NOTE — Progress Notes (Signed)
   Subjective:    Patient ID: Martin Bowman, male    DOB: 05-Dec-1955, 61 y.o.   MRN: [id number]  HPI Here for a well exam. He feels well.    Review of Systems  Constitutional: Negative.   HENT: Negative.   Eyes: Negative.   Respiratory: Negative.   Cardiovascular: Negative.   Gastrointestinal: Negative.   Genitourinary: Negative.   Musculoskeletal: Negative.   Skin: Negative.   Neurological: Negative.   Psychiatric/Behavioral: Negative.        Objective:   Physical Exam  Constitutional: He is oriented to person, place, and time. He appears well-developed and well-nourished. No distress.  HENT:  Head: Normocephalic and atraumatic.  Right Ear: External ear normal.  Left Ear: External ear normal.  Nose: Nose normal.  Mouth/Throat: Oropharynx is clear and moist. No oropharyngeal exudate.  Eyes: Conjunctivae and EOM are normal. Pupils are equal, round, and reactive to light. Right eye exhibits no discharge. Left eye exhibits no discharge. No scleral icterus.  Neck: Neck supple. No JVD present. No tracheal deviation present. No thyromegaly present.  Cardiovascular: Normal rate, regular rhythm, normal heart sounds and intact distal pulses. Exam reveals no gallop and no friction rub.  No murmur heard. Pulmonary/Chest: Effort normal and breath sounds normal. No respiratory distress. He has no wheezes. He has no rales. He exhibits no tenderness.  Abdominal: Soft. Bowel sounds are normal. He exhibits no distension and no mass. There is no tenderness. There is no rebound and no guarding.  Genitourinary: Rectum normal, prostate normal and penis normal. Rectal exam shows guaiac negative stool. No penile tenderness.  Musculoskeletal: Normal range of motion. He exhibits no edema or tenderness.  Lymphadenopathy:    He has no cervical adenopathy.  Neurological: He is alert and oriented to person, place, and time. He has normal reflexes. No cranial nerve deficit. He exhibits normal muscle  tone. Coordination normal.  Skin: Skin is warm and dry. No Lau noted. He is not diaphoretic. No erythema. No pallor.  Psychiatric: He has a normal mood and affect. His behavior is normal. Judgment and thought content normal.          Assessment & Plan:  Well exam. We discussed diet and exercise. Get fasting labs.  Gershon CraneStephen Fry, MD

## 2017-09-01 ENCOUNTER — Telehealth: Payer: Self-pay | Admitting: Family Medicine

## 2017-09-01 NOTE — Telephone Encounter (Signed)
Copied from CRM [id number]. Topic: Inquiry >> Sep 01, 2017  4:12 PM Windy KalataMichael, Taylor L, NT wrote: Reason for CRM: patient states he had a physical done at the end of November and is upset because he has not heard back from his doctor about his lab results. Please advise.  Can you forward again to provider? Looks like no response yet?

## 2017-09-01 NOTE — Telephone Encounter (Signed)
Sent to PCP to review pt's lab results send message back to me so I can inform the pt. Thanks.

## 2017-09-12 NOTE — Telephone Encounter (Signed)
Called and spoke to pt and advised him that provide will review results on Monday and I can call and tell him what Dr.Fry had to say about his lab results. Did go ahead and mail the pt a copy of her results as request by the pt. Sent to PCP

## 2017-09-15 NOTE — Telephone Encounter (Signed)
Please tell him that his labs looked great except the cholesterol is a little high. Watch the diet closely

## 2017-09-15 NOTE — Telephone Encounter (Signed)
Left a VM to call back. CRM created.

## 2017-09-17 NOTE — Telephone Encounter (Signed)
Called and spoke with pt. Pt advised and voiced understanding.  

## 2017-10-27 ENCOUNTER — Encounter: Payer: Self-pay | Admitting: Family Medicine

## 2017-10-27 ENCOUNTER — Ambulatory Visit: Payer: BLUE CROSS/BLUE SHIELD | Admitting: Family Medicine

## 2017-10-27 VITALS — BP 140/72 | HR 84 | Temp 98.6°F | Wt 158.2 lb

## 2017-10-27 DIAGNOSIS — M7061 Trochanteric bursitis, right hip: Secondary | ICD-10-CM

## 2017-10-27 DIAGNOSIS — R220 Localized swelling, mass and lump, head: Secondary | ICD-10-CM

## 2017-10-27 MED ORDER — DICLOFENAC SODIUM 75 MG PO TBEC: 75.0000 mg | DELAYED_RELEASE_TABLET | Freq: Two times a day (BID) | ORAL | 2 refills | Status: DC

## 2017-10-27 NOTE — Progress Notes (Signed)
   Subjective:    Patient ID: Martin Bowman, male    DOB: August 16, 1956, 62 y.o.   MRN: [id number]  HPI Here for 2 issues. First he started having an intermittent sharp pain in the right lateral hip about 2 months ago. No hx of trauma. No pain in the back or down the leg. Advil does not help. Also 3 days ago he had the onset of redness and swelling under the right eye. No URI symptoms. No redness or DC in the eye. Today it has suddenly improved and all the swelling has resolved.    Review of Systems  Constitutional: Negative.   HENT: Positive for facial swelling. Negative for postnasal drip, sinus pressure, sinus pain and sore throat.   Eyes: Negative.   Respiratory: Negative.   Cardiovascular: Negative.   Musculoskeletal: Positive for arthralgias.       Objective:   Physical Exam  Constitutional: He appears well-developed and well-nourished.  HENT:  Right Ear: External ear normal.  Left Ear: External ear normal.  Nose: Nose normal.  Mouth/Throat: Oropharynx is clear and moist.  The right lower eyelid and cheek appear normal   Eyes: Conjunctivae are normal.  Neck: No thyromegaly present.  Cardiovascular: Normal rate, regular rhythm, normal heart sounds and intact distal pulses.  Pulmonary/Chest: Effort normal and breath sounds normal. No respiratory distress. He has no wheezes. He has no rales.  Musculoskeletal:  He is tender over the right greater trochanter. No swelling. The hip has full ROM.   Lymphadenopathy:    He has no cervical adenopathy.          Assessment & Plan:  He had swelling over the right cheek of uncertain etiology. This appears to have resolved now. He will return of it comes back. Otherwise he has bursitis in the hip. He will apply ice packs and take Diclofenac bid. Recheck prn.  Gershon CraneStephen Elo Marmolejos, MD

## 2018-08-10 ENCOUNTER — Ambulatory Visit (INDEPENDENT_AMBULATORY_CARE_PROVIDER_SITE_OTHER): Payer: BLUE CROSS/BLUE SHIELD | Admitting: Family Medicine

## 2018-08-10 ENCOUNTER — Encounter: Payer: Self-pay | Admitting: Family Medicine

## 2018-08-10 VITALS — BP 142/70 | HR 66 | Temp 98.0°F | Ht 68.0 in | Wt 154.3 lb

## 2018-08-10 DIAGNOSIS — Z0000 Encounter for general adult medical examination without abnormal findings: Secondary | ICD-10-CM | POA: Diagnosis not present

## 2018-08-10 DIAGNOSIS — Z125 Encounter for screening for malignant neoplasm of prostate: Secondary | ICD-10-CM | POA: Diagnosis not present

## 2018-08-10 LAB — BASIC METABOLIC PANEL
BUN: 23 mg/dL (ref 6–23)
CALCIUM: 9.3 mg/dL (ref 8.4–10.5)
CO2: 28 mEq/L (ref 19–32)
CREATININE: 0.75 mg/dL (ref 0.40–1.50)
Chloride: 105 mEq/L (ref 96–112)
GFR: 111.85 mL/min (ref >60.00)
GLUCOSE: 112 mg/dL — AB (ref 70–99)
Potassium: 4.6 mEq/L (ref 3.5–5.1)
Sodium: 141 mEq/L (ref 135–145)

## 2018-08-10 LAB — CBC WITH DIFFERENTIAL/PLATELET
BASOS ABS: 0.3 10*3/uL — AB (ref 0.0–0.1)
BASOS PCT: 2.9 % (ref 0.0–3.0)
EOS ABS: 0 10*3/uL (ref 0.0–0.7)
Eosinophils Relative: 0.3 % (ref 0.0–5.0)
HCT: 42.9 % (ref 39.0–52.0)
Hemoglobin: 14.2 g/dL (ref 13.0–17.0)
Lymphocytes Relative: 15.3 % (ref 12.0–46.0)
Lymphs Abs: 1.3 10*3/uL (ref 0.7–4.0)
MCHC: 33.1 g/dL (ref 30.0–36.0)
MCV: 87.2 fl (ref 78.0–100.0)
MONO ABS: 0.5 10*3/uL (ref 0.1–1.0)
Monocytes Relative: 6 % (ref 3.0–12.0)
NEUTROS ABS: 6.7 10*3/uL (ref 1.4–7.7)
NEUTROS PCT: 75.5 % (ref 43.0–77.0)
Platelets: 307 10*3/uL (ref 150.0–400.0)
RBC: 4.92 Mil/uL (ref 4.22–5.81)
RDW: 12.8 % (ref 11.5–15.5)
WBC: 8.8 10*3/uL (ref 4.0–10.5)

## 2018-08-10 LAB — HEPATIC FUNCTION PANEL
ALK PHOS: 57 U/L (ref 39–117)
ALT: 12 U/L (ref 0–53)
AST: 13 U/L (ref 0–37)
Albumin: 4.3 g/dL (ref 3.5–5.2)
Bilirubin, Direct: 0.1 mg/dL (ref 0.0–0.3)
TOTAL PROTEIN: 7.2 g/dL (ref 6.0–8.3)
Total Bilirubin: 0.4 mg/dL (ref 0.2–1.2)

## 2018-08-10 LAB — TSH: TSH: 1.79 u[IU]/mL (ref 0.35–4.50)

## 2018-08-10 LAB — POC URINALSYSI DIPSTICK (AUTOMATED)
BILIRUBIN UA: NEGATIVE
GLUCOSE UA: NEGATIVE
Ketones, UA: NEGATIVE
Leukocytes, UA: NEGATIVE
NITRITE UA: NEGATIVE
PH UA: 6.5 (ref 5.0–8.0)
Protein, UA: POSITIVE — AB
RBC UA: NEGATIVE
SPEC GRAV UA: 1.02 (ref 1.010–1.025)
Urobilinogen, UA: 0.2 E.U./dL

## 2018-08-10 LAB — PSA: PSA: 0.99 ng/mL (ref 0.10–4.00)

## 2018-08-10 LAB — LIPID PANEL
Cholesterol: 202 mg/dL — ABNORMAL HIGH (ref 0–200)
HDL: 46.7 mg/dL (ref >39.00)
LDL CALC: 143 mg/dL — AB (ref 0–99)
NonHDL: 155.41
Total CHOL/HDL Ratio: 4
Triglycerides: 61 mg/dL (ref 0.0–149.0)
VLDL: 12.2 mg/dL (ref 0.0–40.0)

## 2018-08-10 MED ORDER — DICLOFENAC SODIUM 75 MG PO TBEC: 75.0000 mg | DELAYED_RELEASE_TABLET | Freq: Two times a day (BID) | ORAL | 5 refills | Status: DC

## 2018-08-10 NOTE — Progress Notes (Signed)
   Subjective:    Patient ID: Martin Bowman, male    DOB: [birth year], 62 y.o.   MRN: [id number]  HPI Here for a well exam. He feels fine except for several weeks of pain in the right elbow. Diclofenac helps. This is slowly fading away now.    Review of Systems  Constitutional: Negative.   HENT: Negative.   Eyes: Negative.   Respiratory: Negative.   Cardiovascular: Negative.   Gastrointestinal: Negative.   Genitourinary: Negative.   Musculoskeletal: Negative.   Skin: Negative.   Neurological: Negative.   Psychiatric/Behavioral: Negative.        Objective:   Physical Exam  Constitutional: He is oriented to person, place, and time. He appears well-developed and well-nourished. No distress.  HENT:  Head: Normocephalic and atraumatic.  Right Ear: External ear normal.  Left Ear: External ear normal.  Nose: Nose normal.  Mouth/Throat: Oropharynx is clear and moist. No oropharyngeal exudate.  Eyes: Pupils are equal, round, and reactive to light. Conjunctivae and EOM are normal. Right eye exhibits no discharge. Left eye exhibits no discharge. No scleral icterus.  Neck: Neck supple. No JVD present. No tracheal deviation present. No thyromegaly present.  Cardiovascular: Normal rate, regular rhythm, normal heart sounds and intact distal pulses. Exam reveals no gallop and no friction rub.  No murmur heard. Pulmonary/Chest: Effort normal and breath sounds normal. No respiratory distress. He has no wheezes. He has no rales. He exhibits no tenderness.  Abdominal: Soft. Bowel sounds are normal. He exhibits no distension and no mass. There is no tenderness. There is no rebound and no guarding.  Genitourinary: Rectum normal, prostate normal and penis normal. Rectal exam shows guaiac negative stool. No penile tenderness.  Musculoskeletal: Normal range of motion. He exhibits no edema or tenderness.  Lymphadenopathy:    He has no cervical adenopathy.  Neurological: He is alert and oriented to  person, place, and time. He has normal reflexes. He displays normal reflexes. No cranial nerve deficit. He exhibits normal muscle tone. Coordination normal.  Skin: Skin is warm and dry. No Swicegood noted. He is not diaphoretic. No erythema. No pallor.  Psychiatric: He has a normal mood and affect. His behavior is normal. Judgment and thought content normal.          Assessment & Plan:  Well exam. We discussed diet and exercise. Get fasting labs. He has a mild tennis elbow and this seems to be resolving. He can also ice the area as needed.  Gershon CraneStephen Fry, MD

## 2018-08-12 ENCOUNTER — Encounter: Payer: Self-pay | Admitting: Family Medicine

## 2019-01-07 DIAGNOSIS — Z711 Person with feared health complaint in whom no diagnosis is made: Secondary | ICD-10-CM | POA: Diagnosis not present

## 2019-01-07 DIAGNOSIS — Z1159 Encounter for screening for other viral diseases: Secondary | ICD-10-CM | POA: Diagnosis not present

## 2019-05-05 DIAGNOSIS — Z1211 Encounter for screening for malignant neoplasm of colon: Secondary | ICD-10-CM | POA: Diagnosis not present

## 2019-05-18 ENCOUNTER — Encounter: Payer: Self-pay | Admitting: Family Medicine

## 2019-05-18 DIAGNOSIS — K635 Polyp of colon: Secondary | ICD-10-CM | POA: Diagnosis not present

## 2019-05-18 DIAGNOSIS — D124 Benign neoplasm of descending colon: Secondary | ICD-10-CM | POA: Diagnosis not present

## 2019-05-18 DIAGNOSIS — Z1211 Encounter for screening for malignant neoplasm of colon: Secondary | ICD-10-CM | POA: Diagnosis not present

## 2019-05-18 DIAGNOSIS — K5140 Inflammatory polyps of colon without complications: Secondary | ICD-10-CM | POA: Diagnosis not present

## 2019-05-19 HISTORY — PX: COLONOSCOPY: SHX174

## 2019-09-20 ENCOUNTER — Ambulatory Visit: Payer: BC Managed Care – PPO | Attending: Internal Medicine

## 2019-09-20 DIAGNOSIS — Z20822 Contact with and (suspected) exposure to covid-19: Secondary | ICD-10-CM | POA: Diagnosis not present

## 2019-09-21 ENCOUNTER — Other Ambulatory Visit: Payer: BLUE CROSS/BLUE SHIELD

## 2019-09-21 LAB — NOVEL CORONAVIRUS, NAA: SARS-CoV-2, NAA: NOT DETECTED

## 2019-11-29 DIAGNOSIS — Z03818 Encounter for observation for suspected exposure to other biological agents ruled out: Secondary | ICD-10-CM | POA: Diagnosis not present

## 2019-11-29 DIAGNOSIS — Z20828 Contact with and (suspected) exposure to other viral communicable diseases: Secondary | ICD-10-CM | POA: Diagnosis not present

## 2020-12-06 ENCOUNTER — Ambulatory Visit: Payer: Self-pay | Admitting: Family Medicine

## 2020-12-13 ENCOUNTER — Ambulatory Visit (INDEPENDENT_AMBULATORY_CARE_PROVIDER_SITE_OTHER): Payer: BC Managed Care – PPO | Admitting: Family Medicine

## 2020-12-13 ENCOUNTER — Encounter: Payer: Self-pay | Admitting: Family Medicine

## 2020-12-13 ENCOUNTER — Other Ambulatory Visit: Payer: Self-pay

## 2020-12-13 VITALS — BP 130/78 | HR 72 | Temp 97.8°F | Ht 67.5 in | Wt 151.6 lb

## 2020-12-13 DIAGNOSIS — Z0000 Encounter for general adult medical examination without abnormal findings: Secondary | ICD-10-CM

## 2020-12-13 DIAGNOSIS — Z23 Encounter for immunization: Secondary | ICD-10-CM | POA: Diagnosis not present

## 2020-12-13 LAB — HEPATIC FUNCTION PANEL
ALT: 19 U/L (ref 0–53)
AST: 19 U/L (ref 0–37)
Albumin: 4.3 g/dL (ref 3.5–5.2)
Alkaline Phosphatase: 69 U/L (ref 39–117)
Bilirubin, Direct: 0.1 mg/dL (ref 0.0–0.3)
Total Bilirubin: 0.5 mg/dL (ref 0.2–1.2)
Total Protein: 7 g/dL (ref 6.0–8.3)

## 2020-12-13 LAB — LIPID PANEL
Cholesterol: 179 mg/dL (ref 0–200)
HDL: 44.5 mg/dL (ref >39.00)
LDL Cholesterol: 123 mg/dL — ABNORMAL HIGH (ref 0–99)
NonHDL: 134.49
Total CHOL/HDL Ratio: 4
Triglycerides: 57 mg/dL (ref 0.0–149.0)
VLDL: 11.4 mg/dL (ref 0.0–40.0)

## 2020-12-13 LAB — BASIC METABOLIC PANEL
BUN: 20 mg/dL (ref 6–23)
CO2: 29 mEq/L (ref 19–32)
Calcium: 9.4 mg/dL (ref 8.4–10.5)
Chloride: 103 mEq/L (ref 96–112)
Creatinine, Ser: 0.8 mg/dL (ref 0.40–1.50)
GFR: 93.07 mL/min (ref >60.00)
Glucose, Bld: 110 mg/dL — ABNORMAL HIGH (ref 70–99)
Potassium: 4.6 mEq/L (ref 3.5–5.1)
Sodium: 139 mEq/L (ref 135–145)

## 2020-12-13 LAB — CBC WITH DIFFERENTIAL/PLATELET
Basophils Absolute: 0 10*3/uL (ref 0.0–0.1)
Basophils Relative: 0.4 % (ref 0.0–3.0)
Eosinophils Absolute: 0 10*3/uL (ref 0.0–0.7)
Eosinophils Relative: 0.4 % (ref 0.0–5.0)
HCT: 38.9 % — ABNORMAL LOW (ref 39.0–52.0)
Hemoglobin: 13 g/dL (ref 13.0–17.0)
Lymphocytes Relative: 22.4 % (ref 12.0–46.0)
Lymphs Abs: 1.6 10*3/uL (ref 0.7–4.0)
MCHC: 33.4 g/dL (ref 30.0–36.0)
MCV: 86.2 fl (ref 78.0–100.0)
Monocytes Absolute: 0.4 10*3/uL (ref 0.1–1.0)
Monocytes Relative: 6.4 % (ref 3.0–12.0)
Neutro Abs: 5 10*3/uL (ref 1.4–7.7)
Neutrophils Relative %: 70.4 % (ref 43.0–77.0)
Platelets: 302 10*3/uL (ref 150.0–400.0)
RBC: 4.51 Mil/uL (ref 4.22–5.81)
RDW: 13.2 % (ref 11.5–15.5)
WBC: 7 10*3/uL (ref 4.0–10.5)

## 2020-12-13 LAB — TSH: TSH: 1.14 u[IU]/mL (ref 0.35–4.50)

## 2020-12-13 LAB — PSA: PSA: 1.02 ng/mL (ref 0.10–4.00)

## 2020-12-13 LAB — HEMOGLOBIN A1C: Hgb A1c MFr Bld: 5.9 % (ref 4.6–6.5)

## 2020-12-13 MED ORDER — TRAMADOL HCL 50 MG PO TABS: 100.0000 mg | ORAL_TABLET | Freq: Four times a day (QID) | ORAL | 0 refills | Status: DC | PRN

## 2020-12-13 NOTE — Progress Notes (Signed)
Subjective:    Patient ID: Martin Bowman, male    DOB: 1955-11-30, 65 y.o.   MRN: [id number]  HPI Here for a well exam and for advice. He is doing well in general, although he has had some dental issues. He is planning to have all his teeth removed in the next few weeks so he can get dentures made, and his dentist said he would need to see [place] for pain medications. One year ago he had a [employer] screening which showed "mild carotid artery plaques" and "mild osteoporosis" from an [place] of the shin. He was negative for AAA. He asks if he should do this screening again.    Review of Systems  Constitutional: Negative.   HENT: Negative.   Eyes: Negative.   Respiratory: Negative.   Cardiovascular: Negative.   Gastrointestinal: Negative.   Genitourinary: Negative.   Musculoskeletal: Negative.   Skin: Negative.   Neurological: Negative.   Psychiatric/Behavioral: Negative.        Objective:   Physical Exam Constitutional:      General: He is not in acute distress.    Appearance: He is well-developed. He is not diaphoretic.  HENT:     Head: Normocephalic and atraumatic.     Right Ear: External ear normal.     Left Ear: External ear normal.     Nose: Nose normal.     Mouth/Throat:     Pharynx: No oropharyngeal exudate.  Eyes:     General: No scleral icterus.       Right eye: No discharge.        Left eye: No discharge.     Conjunctiva/sclera: Conjunctivae normal.     Pupils: Pupils are equal, round, and reactive to light.  Neck:     Thyroid: No thyromegaly.     Vascular: No JVD.     Trachea: No tracheal deviation.  Cardiovascular:     Rate and Rhythm: Normal rate and regular rhythm.     Heart sounds: Normal heart sounds. No murmur heard. No friction rub. No gallop.   Pulmonary:     Effort: Pulmonary effort is normal. No respiratory distress.     Breath sounds: Normal breath sounds. No wheezing or rales.  Chest:     Chest wall: No tenderness.  Abdominal:     General: Bowel  sounds are normal. There is no distension.     Palpations: Abdomen is soft. There is no mass.     Tenderness: There is no abdominal tenderness. There is no guarding or rebound.  Genitourinary:    Penis: Normal. No tenderness.      Testes: Normal.     Prostate: Normal.     Rectum: Normal. Guaiac result negative.  Musculoskeletal:        General: No tenderness. Normal range of motion.     Cervical back: Neck supple.  Lymphadenopathy:     Cervical: No cervical adenopathy.  Skin:    General: Skin is warm and dry.     Coloration: Skin is not pale.     Findings: No erythema or Niesen.  Neurological:     Mental Status: He is alert and oriented to person, place, and time.     Cranial Nerves: No cranial nerve deficit.     Motor: No abnormal muscle tone.     Coordination: Coordination normal.     Deep Tendon Reflexes: Reflexes are normal and symmetric. Reflexes normal.  Psychiatric:        Behavior: Behavior normal.  Thought Content: Thought content normal.        Judgment: Judgment normal.           Assessment & Plan:  Well exam. We discussed diet and exercise. Get fasting labs. He apparently has some carotid disease and some osteoporosis, so instead of the [employer] screening we will set up our own carotid dopplers and a full DEXA. We will provide some Tramadol he can use for pain control after his upcoming dental procedure.  Gershon Crane, MD

## 2021-01-19 ENCOUNTER — Telehealth: Payer: Self-pay | Admitting: Family Medicine

## 2021-01-19 DIAGNOSIS — M810 Age-related osteoporosis without current pathological fracture: Secondary | ICD-10-CM

## 2021-01-19 DIAGNOSIS — Z8679 Personal history of other diseases of the circulatory system: Secondary | ICD-10-CM

## 2021-01-19 NOTE — Telephone Encounter (Signed)
I am not sure what happened, but I just resent these orders (bone density test and carotid dopplers)

## 2021-01-19 NOTE — Addendum Note (Signed)
Addended by: Gershon Crane A on: 01/19/2021 04:53 PM   Modules accepted: Orders

## 2021-01-19 NOTE — Telephone Encounter (Signed)
The patient called following up on referrals that was supposed to be placed for him after his visit with Dr. Clent Ridges on 03/30.

## 2021-01-22 NOTE — Telephone Encounter (Signed)
Notified pt of the orders, verbalized understanding , state that he already got a call and has an appointment with the Imaging office.

## 2021-01-24 ENCOUNTER — Other Ambulatory Visit: Payer: Self-pay | Admitting: Family Medicine

## 2021-01-24 DIAGNOSIS — Z8679 Personal history of other diseases of the circulatory system: Secondary | ICD-10-CM

## 2021-01-24 DIAGNOSIS — I6523 Occlusion and stenosis of bilateral carotid arteries: Secondary | ICD-10-CM

## 2021-01-25 ENCOUNTER — Encounter (HOSPITAL_COMMUNITY): Payer: BC Managed Care – PPO

## 2021-01-31 ENCOUNTER — Other Ambulatory Visit: Payer: Self-pay

## 2021-01-31 ENCOUNTER — Ambulatory Visit (HOSPITAL_COMMUNITY)
Admission: RE | Admit: 2021-01-31 | Discharge: 2021-01-31 | Disposition: A | Discharge status: Home / Self Care | Payer: BC Managed Care – PPO | Source: Ambulatory Visit | Attending: Cardiology | Admitting: Cardiology

## 2021-01-31 DIAGNOSIS — Z8679 Personal history of other diseases of the circulatory system: Secondary | ICD-10-CM | POA: Diagnosis not present

## 2021-01-31 DIAGNOSIS — I6523 Occlusion and stenosis of bilateral carotid arteries: Secondary | ICD-10-CM | POA: Diagnosis not present

## 2021-12-31 ENCOUNTER — Ambulatory Visit (INDEPENDENT_AMBULATORY_CARE_PROVIDER_SITE_OTHER): Payer: BC Managed Care – PPO | Admitting: Family Medicine

## 2021-12-31 ENCOUNTER — Encounter: Payer: Self-pay | Admitting: Family Medicine

## 2021-12-31 VITALS — BP 128/74 | HR 61 | Temp 98.6°F | Ht 70.0 in | Wt 151.2 lb

## 2021-12-31 DIAGNOSIS — Z125 Encounter for screening for malignant neoplasm of prostate: Secondary | ICD-10-CM | POA: Diagnosis not present

## 2021-12-31 DIAGNOSIS — Z23 Encounter for immunization: Secondary | ICD-10-CM | POA: Diagnosis not present

## 2021-12-31 DIAGNOSIS — Z0000 Encounter for general adult medical examination without abnormal findings: Secondary | ICD-10-CM

## 2021-12-31 LAB — CBC WITH DIFFERENTIAL/PLATELET
Basophils Absolute: 0 10*3/uL (ref 0.0–0.1)
Basophils Relative: 0.5 % (ref 0.0–3.0)
Eosinophils Absolute: 0.1 10*3/uL (ref 0.0–0.7)
Eosinophils Relative: 1.3 % (ref 0.0–5.0)
HCT: 36.6 % — ABNORMAL LOW (ref 39.0–52.0)
Hemoglobin: 12.2 g/dL — ABNORMAL LOW (ref 13.0–17.0)
Lymphocytes Relative: 29.7 % (ref 12.0–46.0)
Lymphs Abs: 1.9 10*3/uL (ref 0.7–4.0)
MCHC: 33.4 g/dL (ref 30.0–36.0)
MCV: 86.4 fl (ref 78.0–100.0)
Monocytes Absolute: 0.6 10*3/uL (ref 0.1–1.0)
Monocytes Relative: 9.9 % (ref 3.0–12.0)
Neutro Abs: 3.7 10*3/uL (ref 1.4–7.7)
Neutrophils Relative %: 58.6 % (ref 43.0–77.0)
Platelets: 314 10*3/uL (ref 150.0–400.0)
RBC: 4.23 Mil/uL (ref 4.22–5.81)
RDW: 13.2 % (ref 11.5–15.5)
WBC: 6.3 10*3/uL (ref 4.0–10.5)

## 2021-12-31 LAB — TSH: TSH: 1.49 u[IU]/mL (ref 0.35–5.50)

## 2021-12-31 LAB — BASIC METABOLIC PANEL
BUN: 19 mg/dL (ref 6–23)
CO2: 29 mEq/L (ref 19–32)
Calcium: 9.1 mg/dL (ref 8.4–10.5)
Chloride: 105 mEq/L (ref 96–112)
Creatinine, Ser: 0.83 mg/dL (ref 0.40–1.50)
GFR: 91.37 mL/min (ref >60.00)
Glucose, Bld: 104 mg/dL — ABNORMAL HIGH (ref 70–99)
Potassium: 4.8 mEq/L (ref 3.5–5.1)
Sodium: 141 mEq/L (ref 135–145)

## 2021-12-31 LAB — HEPATIC FUNCTION PANEL
ALT: 15 U/L (ref 0–53)
AST: 15 U/L (ref 0–37)
Albumin: 4.3 g/dL (ref 3.5–5.2)
Alkaline Phosphatase: 56 U/L (ref 39–117)
Bilirubin, Direct: 0.1 mg/dL (ref 0.0–0.3)
Total Bilirubin: 0.4 mg/dL (ref 0.2–1.2)
Total Protein: 6.9 g/dL (ref 6.0–8.3)

## 2021-12-31 LAB — HEMOGLOBIN A1C: Hgb A1c MFr Bld: 5.9 % (ref 4.6–6.5)

## 2021-12-31 LAB — LIPID PANEL
Cholesterol: 194 mg/dL (ref 0–200)
HDL: 54.4 mg/dL (ref >39.00)
LDL Cholesterol: 130 mg/dL — ABNORMAL HIGH (ref 0–99)
NonHDL: 139.62
Total CHOL/HDL Ratio: 4
Triglycerides: 46 mg/dL (ref 0.0–149.0)
VLDL: 9.2 mg/dL (ref 0.0–40.0)

## 2021-12-31 LAB — PSA: PSA: 0.77 ng/mL (ref 0.10–4.00)

## 2021-12-31 NOTE — Progress Notes (Signed)
? ?  Subjective:  ? ? Patient ID: Martin Bowman, male    DOB: [birth year], 66 y.o.   MRN: [id number] ? ?HPI ?Here for a well exam. He feels great. He has cut back on his smoking to only 2-3 cigarettes a day.  ? ? ?Review of Systems  ?Constitutional: Negative.   ?HENT: Negative.    ?Eyes: Negative.   ?Respiratory: Negative.    ?Cardiovascular: Negative.   ?Gastrointestinal: Negative.   ?Genitourinary: Negative.   ?Musculoskeletal: Negative.   ?Skin: Negative.   ?Neurological: Negative.   ?Psychiatric/Behavioral: Negative.    ? ?   ?Objective:  ? Physical Exam ?Constitutional:   ?   General: He is not in acute distress. ?   Appearance: Normal appearance. He is well-developed. He is not diaphoretic.  ?HENT:  ?   Head: Normocephalic and atraumatic.  ?   Right Ear: External ear normal.  ?   Left Ear: External ear normal.  ?   Nose: Nose normal.  ?   Mouth/Throat:  ?   Pharynx: No oropharyngeal exudate.  ?Eyes:  ?   General: No scleral icterus.    ?   Right eye: No discharge.     ?   Left eye: No discharge.  ?   Conjunctiva/sclera: Conjunctivae normal.  ?   Pupils: Pupils are equal, round, and reactive to light.  ?Neck:  ?   Thyroid: No thyromegaly.  ?   Vascular: No JVD.  ?   Trachea: No tracheal deviation.  ?Cardiovascular:  ?   Rate and Rhythm: Normal rate and regular rhythm.  ?   Heart sounds: Normal heart sounds. No murmur heard. ?  No friction rub. No gallop.  ?Pulmonary:  ?   Effort: Pulmonary effort is normal. No respiratory distress.  ?   Breath sounds: Normal breath sounds. No wheezing or rales.  ?Chest:  ?   Chest wall: No tenderness.  ?Abdominal:  ?   General: Bowel sounds are normal. There is no distension.  ?   Palpations: Abdomen is soft. There is no mass.  ?   Tenderness: There is no abdominal tenderness. There is no guarding or rebound.  ?Genitourinary: ?   Penis: Normal. No tenderness.   ?   Testes: Normal.  ?   Prostate: Normal.  ?   Rectum: Normal. Guaiac result negative.  ?Musculoskeletal:     ?    General: No tenderness. Normal range of motion.  ?   Cervical back: Neck supple.  ?Lymphadenopathy:  ?   Cervical: No cervical adenopathy.  ?Skin: ?   General: Skin is warm and dry.  ?   Coloration: Skin is not pale.  ?   Findings: No erythema or Berkheimer.  ?Neurological:  ?   Mental Status: He is alert and oriented to person, place, and time.  ?   Cranial Nerves: No cranial nerve deficit.  ?   Motor: No abnormal muscle tone.  ?   Coordination: Coordination normal.  ?   Deep Tendon Reflexes: Reflexes are normal and symmetric. Reflexes normal.  ?Psychiatric:     ?   Behavior: Behavior normal.     ?   Thought Content: Thought content normal.     ?   Judgment: Judgment normal.  ? ? ? ? ? ?   ?Assessment & Plan:  ?Well exam. We discussed diet and exercise. Get fasting labs.  ?Gershon Crane, MD ? ? ?

## 2021-12-31 NOTE — Addendum Note (Signed)
Addended by: Carola Rhine on: 12/31/2021 08:38 AM ? ? Modules accepted: Orders ? ?

## 2022-07-05 ENCOUNTER — Encounter: Payer: Self-pay | Admitting: Family Medicine

## 2022-07-05 ENCOUNTER — Telehealth (INDEPENDENT_AMBULATORY_CARE_PROVIDER_SITE_OTHER): Payer: PPO | Admitting: Family Medicine

## 2022-07-05 VITALS — Temp 96.0°F

## 2022-07-05 DIAGNOSIS — U071 COVID-19: Secondary | ICD-10-CM

## 2022-07-05 MED ORDER — NIRMATRELVIR/RITONAVIR (PAXLOVID)TABLET: 3.0000 | ORAL_TABLET | Freq: Two times a day (BID) | ORAL | 0 refills | Status: AC

## 2022-07-05 NOTE — Progress Notes (Signed)
Patient ID: Martin Bowman, male   DOB: [birth year], 66 y.o.   MRN: [id number]  Virtual Visit via Telephone Note  I connected with Martin Bowman on 07/05/22 at  2:00 PM EDT by telephone and verified that I am speaking with the correct person using two identifiers.   I discussed the limitations, risks, security and privacy concerns of performing an evaluation and management service by telephone and the availability of in person appointments. I also discussed with the patient that there may be a patient responsible charge related to this service. The patient expressed understanding and agreed to proceed.  Location patient: home Location provider: work or home office Participants present for the call: patient, provider Patient did not have a visit in the prior 7 days to address this/these issue(s).   History of Present Illness: Martin Bowman called with positive COVID test yesterday.  He woke up yesterday morning with sore throat, headache, nasal congestion, and some cough.  Still feels poorly today.  No significant dyspnea.  Denies any nausea, vomiting, or diarrhea.  This will be his first COVID infection.  He does not take any regular medications.  No chronic heart or lung problems.  Normal renal function.  Last GFR 91.  Past Medical History:  Diagnosis Date   Urinary incontinence    Past Surgical History:  Procedure Laterality Date   COLONOSCOPY  05/19/2019   per Dr. Benson Norway, benign polyps, repeat in 10 yrs    left inguinal hernia repair and umbilical hernia repiar     Dr. Excell Seltzer   TONSILLECTOMY     VASECTOMY     Dr. Serita Butcher    reports that he has been smoking cigarettes. He has a 40.00 pack-year smoking history. He has never used smokeless tobacco. He reports that he does not drink alcohol and does not use drugs. family history is not on file. No Known Allergies    Observations/Objective: Patient sounds cheerful and well on the phone. I do not appreciate any SOB. Speech and thought  processing are grossly intact. Patient reported vitals:  Assessment and Plan:  COVID-19.  We had a long discussion regarding antiviral therapy.  He would like to consider Paxlovid.  We will send in Paxlovid 3 capsules twice daily for 5 days.  Plenty fluids and rest.  Follow-up for any dyspnea or other concerns.  Patient aware of isolation precautions.  Follow Up Instructions:   25053 5-10 99442 11-20 99443 21-30 I did not refer this patient for an OV in the next 24 hours for this/these issue(s).  I discussed the assessment and treatment plan with the patient. The patient was provided an opportunity to ask questions and all were answered. The patient agreed with the plan and demonstrated an understanding of the instructions.   The patient was advised to call back or seek an in-person evaluation if the symptoms worsen or if the condition fails to improve as anticipated.  I provided 13 minutes of non-face-to-face time during this encounter.   Carolann Littler, MD

## 2022-08-13 ENCOUNTER — Encounter: Payer: PPO | Admitting: Family Medicine

## 2022-08-13 ENCOUNTER — Ambulatory Visit (INDEPENDENT_AMBULATORY_CARE_PROVIDER_SITE_OTHER): Payer: PPO

## 2022-08-13 DIAGNOSIS — Z23 Encounter for immunization: Secondary | ICD-10-CM | POA: Diagnosis not present

## 2023-02-24 DIAGNOSIS — D225 Melanocytic nevi of trunk: Secondary | ICD-10-CM | POA: Diagnosis not present

## 2023-02-24 DIAGNOSIS — D485 Neoplasm of uncertain behavior of skin: Secondary | ICD-10-CM | POA: Diagnosis not present

## 2023-02-24 DIAGNOSIS — L814 Other melanin hyperpigmentation: Secondary | ICD-10-CM | POA: Diagnosis not present

## 2023-02-24 DIAGNOSIS — L570 Actinic keratosis: Secondary | ICD-10-CM | POA: Diagnosis not present

## 2023-02-24 DIAGNOSIS — L821 Other seborrheic keratosis: Secondary | ICD-10-CM | POA: Diagnosis not present

## 2023-03-03 ENCOUNTER — Encounter: Payer: Self-pay | Admitting: Family Medicine

## 2023-03-03 ENCOUNTER — Ambulatory Visit (INDEPENDENT_AMBULATORY_CARE_PROVIDER_SITE_OTHER): Payer: PPO | Admitting: Family Medicine

## 2023-03-03 ENCOUNTER — Telehealth: Payer: Self-pay | Admitting: Family Medicine

## 2023-03-03 VITALS — BP 120/76 | HR 59 | Temp 98.0°F | Wt 156.6 lb

## 2023-03-03 DIAGNOSIS — R42 Dizziness and giddiness: Secondary | ICD-10-CM | POA: Diagnosis not present

## 2023-03-03 MED ORDER — ONDANSETRON HCL 8 MG PO TABS: 8.0000 mg | ORAL_TABLET | Freq: Four times a day (QID) | ORAL | 0 refills | Status: DC | PRN

## 2023-03-03 MED ORDER — MECLIZINE HCL 25 MG PO TABS: 25.0000 mg | ORAL_TABLET | ORAL | 0 refills | Status: DC | PRN

## 2023-03-03 NOTE — Progress Notes (Signed)
   Subjective:    Patient ID: Martin Bowman, male    DOB: [birth year], 67 y.o.   MRN: [id number]  HPI Here with his wife for the sudden onset of dizziness yesterday which he describes as the room whirling around him. This is worse when he moves around and better when he is still. He is very nauseated but has not vomited. No headache. No other neurologic deficits.    Review of Systems  Constitutional: Negative.   Respiratory: Negative.    Cardiovascular: Negative.   Neurological:  Positive for dizziness. Negative for tremors, seizures, syncope, facial asymmetry, speech difficulty, weakness, light-headedness, numbness and headaches.       Objective:   Physical Exam Constitutional:      Comments: His wife helps him walk, he is a bit unsteady on his feet. Alert   HENT:     Head: Normocephalic and atraumatic.  Eyes:     Extraocular Movements: Extraocular movements intact.     Pupils: Pupils are equal, round, and reactive to light.  Cardiovascular:     Rate and Rhythm: Normal rate and regular rhythm.     Pulses: Normal pulses.     Heart sounds: Normal heart sounds.  Pulmonary:     Effort: Pulmonary effort is normal.     Breath sounds: Normal breath sounds.  Neurological:     Mental Status: He is alert and oriented to person, place, and time.     Motor: No weakness.     Gait: Gait normal.           Assessment & Plan:  This is vertigo, likely vestibular in origin.  I explained the nature of this condition. He will drink fluids and use Zofran as needed for nausea. He can use Meclizine for the dizziness. He will follow up with [place] if he does not feel better in 2 days.  Gershon Crane, MD

## 2023-03-03 NOTE — Telephone Encounter (Signed)
Pt was just seen today.  Pharmacy is telling them they need more information from MD before they can fill this Rx (ondansetron (ZOFRAN) 8 MG tablet)  [id number] [place], [place] - [address] AT [facility] [address] [facility] [address] Phone: [phone]  Fax: [phone]    Please advise.

## 2023-03-05 ENCOUNTER — Encounter: Payer: Self-pay | Admitting: Family Medicine

## 2023-03-05 ENCOUNTER — Telehealth: Payer: Self-pay

## 2023-03-05 NOTE — Telephone Encounter (Addendum)
Called to F/U  Please call Pt: [phone]

## 2023-03-05 NOTE — Telephone Encounter (Signed)
Patient Advocate Encounter   Received notification that prior authorization for Ondansetron  is required.   PA submitted on 03/05/2023 Key [id number] Status is pending  Call [phone] for determination

## 2023-03-05 NOTE — Telephone Encounter (Signed)
Pt is calling and still having dizziness please advise

## 2023-03-06 ENCOUNTER — Other Ambulatory Visit (HOSPITAL_COMMUNITY): Payer: Self-pay

## 2023-03-06 NOTE — Telephone Encounter (Signed)
Call in Valium 5 mg to take one every 6 hours as needed for dizziness, #30 with no rf

## 2023-03-06 NOTE — Telephone Encounter (Signed)
Spoke with pt pharmacy state that Rx has already been filled and pt picked up

## 2023-03-06 NOTE — Telephone Encounter (Signed)
Patient Advocate Encounter  Received notification from [employer]  that the request for prior authorization for Ondansetron HCl 8MG  tablets has been denied due to medication not being used for chemotherapy/radiation induced nausea vomiting or surgically induced nausea and vomiting caused by prophylaxis.

## 2023-03-07 MED ORDER — DIAZEPAM 5 MG PO TABS
5.0000 mg | ORAL_TABLET | Freq: Four times a day (QID) | ORAL | 0 refills | Status: DC | PRN
Start: 2023-03-07 — End: 2023-04-10

## 2023-03-07 NOTE — Telephone Encounter (Signed)
I sent this in

## 2023-03-07 NOTE — Telephone Encounter (Signed)
Cancel Zofran and call in Phenergan 25 mg to take every 4 hours prn nausea, #60 with no rf

## 2023-03-11 MED ORDER — PROMETHAZINE HCL 25 MG PO TABS: 25.0000 mg | ORAL_TABLET | ORAL | 0 refills | Status: DC | PRN

## 2023-03-11 NOTE — Addendum Note (Signed)
Addended by: Kern Reap B on: 03/11/2023 03:49 PM   Modules accepted: Orders

## 2023-03-11 NOTE — Telephone Encounter (Signed)
Spoke to the pharmacist.  The patient already picked up Zofran.  Phenergan called in.

## 2023-04-10 ENCOUNTER — Ambulatory Visit (INDEPENDENT_AMBULATORY_CARE_PROVIDER_SITE_OTHER): Payer: PPO | Admitting: Family Medicine

## 2023-04-10 ENCOUNTER — Encounter: Payer: Self-pay | Admitting: Family Medicine

## 2023-04-10 VITALS — BP 128/82 | HR 59 | Temp 98.3°F | Ht 68.5 in | Wt 155.8 lb

## 2023-04-10 DIAGNOSIS — Z0000 Encounter for general adult medical examination without abnormal findings: Secondary | ICD-10-CM | POA: Diagnosis not present

## 2023-04-10 DIAGNOSIS — Z122 Encounter for screening for malignant neoplasm of respiratory organs: Secondary | ICD-10-CM

## 2023-04-10 LAB — HEPATIC FUNCTION PANEL
ALT: 14 U/L (ref 0–53)
AST: 18 U/L (ref 0–37)
Albumin: 4.3 g/dL (ref 3.5–5.2)
Alkaline Phosphatase: 64 U/L (ref 39–117)
Bilirubin, Direct: 0.1 mg/dL (ref 0.0–0.3)
Total Bilirubin: 0.6 mg/dL (ref 0.2–1.2)
Total Protein: 7.1 g/dL (ref 6.0–8.3)

## 2023-04-10 LAB — CBC WITH DIFFERENTIAL/PLATELET
Basophils Absolute: 0 10*3/uL (ref 0.0–0.1)
Basophils Relative: 0.7 % (ref 0.0–3.0)
Eosinophils Absolute: 0.2 10*3/uL (ref 0.0–0.7)
Eosinophils Relative: 3 % (ref 0.0–5.0)
HCT: 38.3 % — ABNORMAL LOW (ref 39.0–52.0)
Hemoglobin: 12.4 g/dL — ABNORMAL LOW (ref 13.0–17.0)
Lymphocytes Relative: 31.6 % (ref 12.0–46.0)
Lymphs Abs: 1.7 10*3/uL (ref 0.7–4.0)
MCHC: 32.5 g/dL (ref 30.0–36.0)
MCV: 86.7 fl (ref 78.0–100.0)
Monocytes Absolute: 0.6 10*3/uL (ref 0.1–1.0)
Monocytes Relative: 11.6 % (ref 3.0–12.0)
Neutro Abs: 2.9 10*3/uL (ref 1.4–7.7)
Neutrophils Relative %: 53.1 % (ref 43.0–77.0)
Platelets: 321 10*3/uL (ref 150.0–400.0)
RBC: 4.42 Mil/uL (ref 4.22–5.81)
RDW: 13.1 % (ref 11.5–15.5)
WBC: 5.5 10*3/uL (ref 4.0–10.5)

## 2023-04-10 LAB — BASIC METABOLIC PANEL
BUN: 22 mg/dL (ref 6–23)
CO2: 29 mEq/L (ref 19–32)
Calcium: 9.3 mg/dL (ref 8.4–10.5)
Chloride: 104 mEq/L (ref 96–112)
Creatinine, Ser: 0.83 mg/dL (ref 0.40–1.50)
GFR: 90.55 mL/min (ref >60.00)
Glucose, Bld: 90 mg/dL (ref 70–99)
Potassium: 4.6 mEq/L (ref 3.5–5.1)
Sodium: 140 mEq/L (ref 135–145)

## 2023-04-10 LAB — LIPID PANEL
Cholesterol: 195 mg/dL (ref 0–200)
HDL: 49.2 mg/dL (ref >39.00)
LDL Cholesterol: 136 mg/dL — ABNORMAL HIGH (ref 0–99)
NonHDL: 145.91
Total CHOL/HDL Ratio: 4
Triglycerides: 51 mg/dL (ref 0.0–149.0)
VLDL: 10.2 mg/dL (ref 0.0–40.0)

## 2023-04-10 LAB — HEMOGLOBIN A1C: Hgb A1c MFr Bld: 5.8 % (ref 4.6–6.5)

## 2023-04-10 LAB — TSH: TSH: 1.56 u[IU]/mL (ref 0.35–5.50)

## 2023-04-10 LAB — PSA: PSA: 1.41 ng/mL (ref 0.10–4.00)

## 2023-04-10 MED ORDER — DIAZEPAM 5 MG PO TABS: 5.0000 mg | ORAL_TABLET | Freq: Four times a day (QID) | ORAL | 0 refills | Status: DC | PRN

## 2023-04-10 NOTE — Progress Notes (Signed)
Subjective:    Patient ID: Martin Bowman, male    DOB: 1956-04-07, 67 y.o.   MRN: [id number]  HPI Here for a well exam. He feels fine except for occasional bouts of vertigo. He gets good relief with Valium.    Review of Systems  Constitutional: Negative.   HENT: Negative.    Eyes: Negative.   Respiratory: Negative.    Cardiovascular: Negative.   Gastrointestinal: Negative.   Genitourinary: Negative.   Musculoskeletal: Negative.   Skin: Negative.   Neurological:  Positive for dizziness.  Psychiatric/Behavioral: Negative.         Objective:   Physical Exam Constitutional:      General: He is not in acute distress.    Appearance: Normal appearance. He is well-developed. He is not diaphoretic.  HENT:     Head: Normocephalic and atraumatic.     Right Ear: External ear normal.     Left Ear: External ear normal.     Nose: Nose normal.     Mouth/Throat:     Pharynx: No oropharyngeal exudate.  Eyes:     General: No scleral icterus.       Right eye: No discharge.        Left eye: No discharge.     Conjunctiva/sclera: Conjunctivae normal.     Pupils: Pupils are equal, round, and reactive to light.  Neck:     Thyroid: No thyromegaly.     Vascular: No JVD.     Trachea: No tracheal deviation.  Cardiovascular:     Rate and Rhythm: Normal rate and regular rhythm.     Pulses: Normal pulses.     Heart sounds: Normal heart sounds. No murmur heard.    No friction rub. No gallop.  Pulmonary:     Effort: Pulmonary effort is normal. No respiratory distress.     Breath sounds: Normal breath sounds. No wheezing or rales.  Chest:     Chest wall: No tenderness.  Abdominal:     General: Bowel sounds are normal. There is no distension.     Palpations: Abdomen is soft. There is no mass.     Tenderness: There is no abdominal tenderness. There is no guarding or rebound.  Genitourinary:    Penis: Normal. No tenderness.      Testes: Normal.     Prostate: Normal.     Rectum: Normal.  Guaiac result negative.  Musculoskeletal:        General: No tenderness. Normal range of motion.     Cervical back: Neck supple.  Lymphadenopathy:     Cervical: No cervical adenopathy.  Skin:    General: Skin is warm and dry.     Coloration: Skin is not pale.     Findings: No erythema or [place].  Neurological:     General: No focal deficit present.     Mental Status: He is alert and oriented to person, place, and time.     Cranial Nerves: No cranial nerve deficit.     Motor: No abnormal muscle tone.     Coordination: Coordination normal.     Deep Tendon Reflexes: Reflexes are normal and symmetric. Reflexes normal.  Psychiatric:        Mood and Affect: Mood normal.        Behavior: Behavior normal.        Thought Content: Thought content normal.        Judgment: Judgment normal.           Assessment &  Plan:  Well exam. We discussed diet and exercise. Get fasting labs. Per his request we will set up a lung CT cancer screen test. Gershon Crane, MD

## 2023-04-30 ENCOUNTER — Ambulatory Visit
Admission: RE | Admit: 2023-04-30 | Discharge: 2023-04-30 | Disposition: A | Discharge status: Home / Self Care | Payer: PPO | Source: Ambulatory Visit | Attending: Family Medicine | Admitting: Family Medicine

## 2023-04-30 DIAGNOSIS — Z122 Encounter for screening for malignant neoplasm of respiratory organs: Secondary | ICD-10-CM

## 2023-04-30 DIAGNOSIS — Z87891 Personal history of nicotine dependence: Secondary | ICD-10-CM | POA: Diagnosis not present

## 2023-05-14 DIAGNOSIS — H43813 Vitreous degeneration, bilateral: Secondary | ICD-10-CM | POA: Diagnosis not present

## 2023-05-22 DIAGNOSIS — H43813 Vitreous degeneration, bilateral: Secondary | ICD-10-CM | POA: Diagnosis not present

## 2023-05-31 ENCOUNTER — Encounter: Payer: Self-pay | Admitting: Family Medicine

## 2023-06-12 ENCOUNTER — Telehealth: Payer: PPO | Admitting: Family Medicine

## 2023-06-12 DIAGNOSIS — Z0000 Encounter for general adult medical examination without abnormal findings: Secondary | ICD-10-CM

## 2023-06-12 NOTE — Progress Notes (Signed)
 Patient unable to obtain vital signs due to telehealth visit

## 2023-06-12 NOTE — Patient Instructions (Signed)
I really enjoyed getting to talk with you today! I am available on Tuesdays and Thursdays for virtual visits if you have any questions or concerns, or if I can be of any further assistance.   CHECKLIST FROM ANNUAL WELLNESS VISIT:  -Follow up (please call to schedule if not scheduled after visit):   -yearly for annual wellness visit with primary care office  Here is a list of your preventive care/health maintenance measures and the plan for each if any are due:  PLAN For any measures below that may be due:  -can get the vaccines at the pharmacy  Health Maintenance  Topic Date Due   Zoster Vaccines- Shingrix (1 of 2) 09/30/2005   INFLUENZA VACCINE  04/17/2023   COVID-19 Vaccine (3 - 2023-24 season) 05/18/2023   Lung Cancer Screening  04/29/2024   Medicare Annual Wellness (AWV)  06/11/2024   DTaP/Tdap/Td (2 - Td or Tdap) 08/07/2027   Colonoscopy  06/15/2029   Pneumonia Vaccine 71+ Years old  Completed   Hepatitis C Screening  Completed   HPV VACCINES  Aged Out    -See a dentist at least yearly  -Get your eyes checked and then per your eye specialist's recommendations  -Other issues addressed today:   -I have included below further information regarding a healthy whole foods based diet, physical activity guidelines for adults, stress management and opportunities for social connections. I hope you find this information useful.   -----------------------------------------------------------------------------------------------------------------------------------------------------------------------------------------------------------------------------------------------------------  NUTRITION: -eat real food: lots of colorful vegetables (half the plate) and fruits -5-7 servings of vegetables and fruits per day (fresh or steamed is best), exp. 2 servings of vegetables with lunch and dinner and 2 servings of fruit per day. Berries and greens such as kale and collards are great choices.   -consume on a regular basis: whole grains (make sure first ingredient on label contains the word "whole"), fresh fruits, fish, nuts, seeds, healthy oils (such as olive oil, avocado oil, grape seed oil) -may eat small amounts of dairy and lean meat on occasion, but avoid processed meats such as ham, bacon, lunch meat, etc. -drink water -try to avoid fast food and pre-packaged foods, processed meat -most experts advise limiting sodium to < 2300mg  per day, should limit further is any chronic conditions such as high blood pressure, heart disease, diabetes, etc. The American Heart Association advised that < 1500mg  is is ideal -try to avoid foods that contain any ingredients with names you do not recognize  -try to avoid sugar/sweets (except for the natural sugar that occurs in fresh fruit) -try to avoid sweet drinks -try to avoid white rice, white bread, pasta (unless whole grain), white or yellow potatoes  EXERCISE GUIDELINES FOR ADULTS: -if you wish to increase your physical activity, do so gradually and with the approval of your doctor -STOP and seek medical care immediately if you have any chest pain, chest discomfort or trouble breathing when starting or increasing exercise  -move and stretch your body, legs, feet and arms when sitting for long periods -Physical activity guidelines for optimal health in adults: -least 150 minutes per week of aerobic exercise (can talk, but not sing) once approved by your doctor, 20-30 minutes of sustained activity or two 10 minute episodes of sustained activity every day.  -resistance training at least 2 days per week if approved by your doctor -balance exercises 3+ days per week:   Stand somewhere where you have something sturdy to hold onto if you lose balance.    1) lift  up on toes, start with 5x per day and work up to 20x   2) stand and lift on leg straight out to the side so that foot is a few inches of the floor, start with 5x each side and work up to 20x  each side   3) stand on one foot, start with 5 seconds each side and work up to 20 seconds on each side  If you need ideas or help with getting more active:  -Silver sneakers https://tools.silversneakers.com  -Walk with a Doc: http://www.duncan-williams.com/  -try to include resistance (weight lifting/strength building) and balance exercises twice per week: or the following link for ideas: http://castillo-powell.com/  BuyDucts.dk  STRESS MANAGEMENT: -can try meditating, or just sitting quietly with deep breathing while intentionally relaxing all parts of your body for 5 minutes daily -if you need further help with stress, anxiety or depression please follow up with your primary doctor or contact the wonderful folks at [facility]: [phone]  SOCIAL CONNECTIONS: -options in [place] if you wish to engage in more social and exercise related activities:  -Silver sneakers https://tools.silversneakers.com  -Walk with a Doc: http://www.duncan-williams.com/  -Check out the [facility] Active Adults 50+ section on the [place] of [employer] (hiking clubs, book clubs, cards and games, chess, exercise classes, aquatic classes and much more) - see the website for details: https://www.[place].gov/departments/parks-recreation/active-adults50  -YouTube has lots of exercise videos for different ages and abilities as well  -[facility] (a variety of indoor and outdoor inperson activities for adults). [phone]. [address].  -Virtual Online Classes (a variety of topics): see seniorplanet.org or call [phone]  -consider volunteering at a school, hospice center, church, senior center or elsewhere

## 2023-06-12 NOTE — Progress Notes (Signed)
PATIENT CHECK-IN and HEALTH RISK ASSESSMENT QUESTIONNAIRE:  -completed by phone/video for upcoming Medicare Preventive Visit  Pre-Visit Check-in: 1)Vitals (height, wt, BP, etc) - record in vitals section for visit on day of visit Request home vitals (wt, BP, etc.) and enter into vitals, THEN update Vital Signs SmartPhrase below at the top of the HPI. See below.  2)Review and Update Medications, Allergies PMH, Surgeries, Social history in Epic 3)Hospitalizations in the last year with date/reason? No  4)Review and Update Care Team (patient's specialists) in Epic 5) Complete PHQ9 in Epic  6) Complete Fall Screening in Epic 7)Review all Health Maintenance Due and order under PCP if not done.  Medicare Wellness Patient Questionnaire:  Answer theses question about your habits: Do you drink alcohol? no Have you ever smoked?Yes Quit date if applicable? 2022  How many packs a day do/did you smoke? 2 packs  Do you use smokeless tobacco? No Do you use an illicit drugs? No Do you exercises? 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  Typical breakfast: N/A  Typical lunch: Fast food  Typical dinner: Fast food Typical snacks: Sweets  Beverages:  N/A  Answer theses question about you: Can you perform most household chores? Yes Do you find it hard to follow a conversation in a noisy room? No Do you often ask people to speak up or repeat themselves? No Do you feel that you have a problem with memory? Sometimes  Do you balance your checkbook and or bank acounts? Yes Do you feel safe at home?Yes  Last dentist visit? 2 weeks Do you need assistance with any of the following: Please note if so No  Driving?  Feeding yourself?  Getting from bed to chair?  Getting to the toilet?  Bathing or showering?  Dressing yourself?  Managing money?  Climbing a flight of stairs  Preparing meals?    Do you have Advanced Directives in place (Living Will, Healthcare Power  or Attorney)? No   Last eye Exam and location? 2 weeks ago, [facility]   Do you currently use prescribed or non-prescribed narcotic or opioid pain medications? Yes   Do you have a history or close family history of breast, ovarian, tubal or peritoneal cancer or a family member with BRCA (breast cancer susceptibility 1 and 2) gene mutations? No  Request home vitals (wt, BP, etc.) and enter into vitals, THEN update Vital Signs SmartPhrase below at the top of the HPI. See below.   Nurse/Assistant Credentials/time stamp: Mg 4:53 pm   ----------------------------------------------------------------------------------------------------------------------------------------------------------------------------------------------------------------------  Because this visit was a virtual/telehealth visit, some criteria may be missing or patient reported. Any vitals not documented were not able to be obtained and vitals that have been documented are patient reported.    MEDICARE ANNUAL PREVENTIVE CARE VISIT WITH PROVIDER (Welcome to Medicare, initial annual wellness or annual wellness exam)  Virtual Visit via Phone Note  I connected with Jamesley Wenman Shifflett on 06/12/23  by phone and verified that I am speaking with the correct person using two identifiers.  Location patient: home Location provider:work or home office Persons participating in the virtual visit: patient, provider  Concerns and/or follow up today: reports doing well over all. Saw the eye doctor recently. Vertigo has cleared up.    See HM section in Epic for other details of completed HM.    ROS: negative for report of fevers, unintentional weight loss, hearing loss or change, chest pain, sob, hemoptysis, melena, hematochezia,  hematuria, falls, bleeding or bruising, thoughts of suicide or self harm, memory loss  Patient-completed extensive health risk assessment - reviewed and discussed with the patient: See Health Risk Assessment  completed with patient prior to the visit either above or in recent phone note. This was reviewed in detailed with the patient today and appropriate recommendations, orders and referrals were placed as needed per Summary below and patient instructions.   Review of Medical History: -PMH, PSH, Family History and current specialty and care providers reviewed and updated and listed below   Patient Care Team: Nelwyn Salisbury, MD as PCP - General   Past Medical History:  Diagnosis Date   Urinary incontinence     Past Surgical History:  Procedure Laterality Date   COLONOSCOPY  05/19/2019   per Dr. Elnoria Howard, benign polyps, repeat in 10 yrs    left inguinal hernia repair and umbilical hernia repiar     Dr. Johna Sheriff   TONSILLECTOMY     VASECTOMY     Dr. Aldean Ast    Social History   Socioeconomic History   Marital status: Single    Spouse name: Not on file   Number of children: Not on file   Years of education: Not on file   Highest education level: Not on file  Occupational History   Not on file  Tobacco Use   Smoking status: Every Day    Current packs/day: 0.00    Average packs/day: 1 pack/day for 40.0 years (40.0 ttl pk-yrs)    Types: Cigarettes    Start date: 05/19/1973    Last attempt to quit: 05/19/2013    Years since quitting: 10.0   Smokeless tobacco: Never  Substance and Sexual Activity   Alcohol use: No    Alcohol/week: 0.0 standard drinks of alcohol   Drug use: No   Sexual activity: Not on file  Other Topics Concern   Not on file  Social History Narrative   Not on file   Social Determinants of Health   Financial Resource Strain: Low Risk  (06/12/2023)   Overall Financial Resource Strain (CARDIA)    Difficulty of Paying Living Expenses: Not hard at all  Food Insecurity: No Food Insecurity (06/12/2023)   Hunger Vital Sign    Worried About Running Out of Food in the Last Year: Never true    Ran Out of Food in the Last Year: Never true  Transportation Needs: No  Transportation Needs (06/12/2023)   PRAPARE - Administrator, Civil Service (Medical): No    Lack of Transportation (Non-Medical): No  Physical Activity: Inactive (06/12/2023)   Exercise Vital Sign    Days of Exercise per Week: 0 days    Minutes of Exercise per Session: 0 min  Stress: No Stress Concern Present (06/12/2023)   [employer] of Occupational Health - Occupational Stress Questionnaire    Feeling of Stress : Not at all  Social Connections: Socially Isolated (06/12/2023)   Social Connection and Isolation Panel [NHANES]    Frequency of Communication with Friends and Family: Three times a week    Frequency of Social Gatherings with Friends and Family: Once a week    Attends Religious Services: Never    Database administrator or Organizations: No    Attends Banker Meetings: Never    Marital Status: Never married  Intimate Partner Violence: Not At Risk (06/12/2023)   Humiliation, Afraid, Rape, and Kick questionnaire    Fear of Current or Ex-Partner: No  Emotionally Abused: No    Physically Abused: No    Sexually Abused: No    No family history on file.  Current Outpatient Medications on File Prior to Visit  Medication Sig Dispense Refill   diazepam (VALIUM) 5 MG tablet Take 1 tablet (5 mg total) by mouth every 6 (six) hours as needed (vertigo). 60 tablet 0   Glucosamine 500 MG CAPS Take 1,500 mg by mouth daily at 12 noon.     Multiple Vitamin (MULTIVITAMIN) tablet Take 1 tablet by mouth daily.     Zinc 50 MG TABS Take by mouth. OTC     No current facility-administered medications on file prior to visit.    No Known Allergies     Physical Exam Vitals requested from patient and listed below if patient had equipment and was able to obtain at home for this virtual visit: There were no vitals filed for this visit. Estimated body mass index is 23.34 kg/m as calculated from the following:   Height as of 04/10/23: 5' 8.5" (1.74 m).   Weight as  of 04/10/23: 155 lb 12.8 oz (70.7 kg).  EKG (optional): deferred due to virtual visit  GENERAL: alert, oriented, no acute distress detected; full vision exam deferred due to pandemic and/or virtual encounter   PSYCH/NEURO: pleasant and cooperative, no obvious depression or anxiety, speech and thought processing grossly intact, Cognitive function grossly intact  Flowsheet Row Office Visit from [facility]/10/2023 in [facility] [place]  PHQ-9 Total Score 0           06/12/2023    4:42 PM 04/10/2023    8:08 AM 12/31/2021    8:04 AM 08/10/2018    8:23 AM  Depression screen PHQ 2/9  Decreased Interest 0 0 0 0  Down, Depressed, Hopeless 0 0 0 0  PHQ - 2 Score 0 0 0 0  Altered sleeping  0 0   Tired, decreased energy  0 0   Change in appetite  0 0   Feeling bad or failure about yourself   0 0   Trouble concentrating  0 0   Moving slowly or fidgety/restless  0 0   Suicidal thoughts  0 0   PHQ-9 Score  0 0   Difficult doing work/chores  Not difficult at all Not difficult at all        08/10/2018    8:22 AM 12/31/2021    8:03 AM 04/10/2023    8:08 AM 06/12/2023    4:42 PM  Fall Risk  Falls in the past year? 0 1 0 0  Was there an injury with Fall?  0 0 0  Fall Risk Category Calculator  1 0 0  Fall Risk Category (Retired)  Low    (RETIRED) Patient Fall Risk Level  Low fall risk    Patient at Risk for Falls Due to  No Fall Risks No Fall Risks No Fall Risks  Fall risk Follow up  Falls evaluation completed Falls evaluation completed Falls evaluation completed     SUMMARY AND PLAN:  Encounter for Medicare annual wellness exam   Discussed applicable health maintenance/preventive health measures and advised and referred or ordered per patient preferences: -discussed vaccines due and where he could get each, advised to let us know when gets so that we can update his record Health Maintenance  Topic Date Due   Zoster Vaccines- Shingrix (1 of 2) 09/30/2005    INFLUENZA VACCINE  04/17/2023   COVID-19 Vaccine (3 -  2023-24 season) 05/18/2023   Lung Cancer Screening  04/29/2024   Medicare Annual Wellness (AWV)  06/11/2024   DTaP/Tdap/Td (2 - Td or Tdap) 08/07/2027   Colonoscopy  06/15/2029   Pneumonia Vaccine 10+ Years old  Completed   Hepatitis C Screening  Completed   HPV VACCINES  Aged [employer] and counseling on the following was provided based on the above review of health and a plan/checklist for the patient, along with additional information discussed, was provided for the patient in the patient instructions :  -Advised on importance of completing advanced directives, discussed options for completing and provided information in patient instructions as well -Advised and counseled on a healthy lifestyle - including the importance of a healthy diet, regular physical activity, social connections and stress management. -Reviewed patient's current diet. Advised and counseled on a whole foods based healthy diet. A summary of a healthy diet was provided in the Patient Instructions.  -reviewed patient's current physical activity level and discussed exercise guidelines for adults. Discussed community resources and ideas for safe exercise at home to assist in meeting exercise guideline recommendations in a safe and healthy way.  -Advise yearly dental visits at minimum and regular eye exams   Follow up: see patient instructions   Patient Instructions  I really enjoyed getting to talk with you today! I am available on Tuesdays and Thursdays for virtual visits if you have any questions or concerns, or if I can be of any further assistance.   CHECKLIST FROM ANNUAL WELLNESS VISIT:  -Follow up (please call to schedule if not scheduled after visit):   -yearly for annual wellness visit with primary care office  Here is a list of your preventive care/health maintenance measures and the plan for each if any are due:  PLAN For any measures below  that may be due:  -can get the vaccines at the pharmacy  Health Maintenance  Topic Date Due   Zoster Vaccines- Shingrix (1 of 2) 09/30/2005   INFLUENZA VACCINE  04/17/2023   COVID-19 Vaccine (3 - 2023-24 season) 05/18/2023   Lung Cancer Screening  04/29/2024   Medicare Annual Wellness (AWV)  06/11/2024   DTaP/Tdap/Td (2 - Td or Tdap) 08/07/2027   Colonoscopy  06/15/2029   Pneumonia Vaccine 67+ Years old  Completed   Hepatitis C Screening  Completed   HPV VACCINES  Aged Out    -See a dentist at least yearly  -Get your eyes checked and then per your eye specialist's recommendations  -Other issues addressed today:   -I have included below further information regarding a healthy whole foods based diet, physical activity guidelines for adults, stress management and opportunities for social connections. I hope you find this information useful.   -----------------------------------------------------------------------------------------------------------------------------------------------------------------------------------------------------------------------------------------------------------  NUTRITION: -eat real food: lots of colorful vegetables (half the plate) and fruits -5-7 servings of vegetables and fruits per day (fresh or steamed is best), exp. 2 servings of vegetables with lunch and dinner and 2 servings of fruit per day. Berries and greens such as kale and collards are great choices.  -consume on a regular basis: whole grains (make sure first ingredient on label contains the word "whole"), fresh fruits, fish, nuts, seeds, healthy oils (such as olive oil, avocado oil, grape seed oil) -may eat small amounts of dairy and lean meat on occasion, but avoid processed meats such as ham, bacon, lunch meat, etc. -drink water -try to avoid fast food and pre-packaged foods, processed meat -most experts advise limiting  sodium to < 2300mg  per day, should limit further is any chronic  conditions such as high blood pressure, heart disease, diabetes, etc. The American Heart Association advised that < 1500mg  is is ideal -try to avoid foods that contain any ingredients with names you do not recognize  -try to avoid sugar/sweets (except for the natural sugar that occurs in fresh fruit) -try to avoid sweet drinks -try to avoid white rice, white bread, pasta (unless whole grain), white or yellow potatoes  EXERCISE GUIDELINES FOR ADULTS: -if you wish to increase your physical activity, do so gradually and with the approval of your doctor -STOP and seek medical care immediately if you have any chest pain, chest discomfort or trouble breathing when starting or increasing exercise  -move and stretch your body, legs, feet and arms when sitting for long periods -Physical activity guidelines for optimal health in adults: -least 150 minutes per week of aerobic exercise (can talk, but not sing) once approved by your doctor, 20-30 minutes of sustained activity or two 10 minute episodes of sustained activity every day.  -resistance training at least 2 days per week if approved by your doctor -balance exercises 3+ days per week:   Stand somewhere where you have something sturdy to hold onto if you lose balance.    1) lift up on toes, start with 5x per day and work up to 20x   2) stand and lift on leg straight out to the side so that foot is a few inches of the floor, start with 5x each side and work up to 20x each side   3) stand on one foot, start with 5 seconds each side and work up to 20 seconds on each side  If you need ideas or help with getting more active:  -Silver sneakers https://tools.silversneakers.com  -Walk with a Doc: http://www.duncan-williams.com/  -try to include resistance (weight lifting/strength building) and balance exercises twice per week: or the following link for  ideas: http://castillo-powell.com/  BuyDucts.dk  STRESS MANAGEMENT: -can try meditating, or just sitting quietly with deep breathing while intentionally relaxing all parts of your body for 5 minutes daily -if you need further help with stress, anxiety or depression please follow up with your primary doctor or contact the wonderful folks at [facility]: [phone]  SOCIAL CONNECTIONS: -options in [place] if you wish to engage in more social and exercise related activities:  -Silver sneakers https://tools.silversneakers.com  -Walk with a Doc: http://www.duncan-williams.com/  -Check out the [facility] Active Adults 50+ section on the [place] of [employer] (hiking clubs, book clubs, cards and games, chess, exercise classes, aquatic classes and much more) - see the website for details: https://www.[place].gov/departments/parks-recreation/active-adults50  -YouTube has lots of exercise videos for different ages and abilities as well  -[facility] (a variety of indoor and outdoor inperson activities for adults). (228)632-9463. [address].  -Virtual Online Classes (a variety of topics): see seniorplanet.org or call [phone]) [phone]  -consider volunteering at a school, hospice center, church, senior center or elsewhere           Terressa Koyanagi, DO

## 2023-11-20 DIAGNOSIS — H5712 Ocular pain, left eye: Secondary | ICD-10-CM | POA: Diagnosis not present

## 2024-02-06 ENCOUNTER — Telehealth: Payer: Self-pay | Admitting: Family Medicine

## 2024-02-06 NOTE — Telephone Encounter (Signed)
 Copied from CRM [id number]. Topic: Appointments - Scheduling Inquiry for Clinic >> Feb 06, 2024  2:24 PM [employer] wrote: Reason for CRM: Patient would like to know if lung screening needs to be done annually and if so he would like to schedule his next one.

## 2024-02-10 NOTE — Telephone Encounter (Signed)
 Yes we should get another chest CT in August

## 2024-02-11 NOTE — Telephone Encounter (Signed)
 Patient informed of the message below and was reminded of the appt on 7/31 for CPE.

## 2024-04-07 ENCOUNTER — Inpatient Hospital Stay (HOSPITAL_BASED_OUTPATIENT_CLINIC_OR_DEPARTMENT_OTHER)
Admission: EM | Admit: 2024-04-07 | Discharge: 2024-04-10 | DRG: 187 | Disposition: A | Discharge status: Home / Self Care | Attending: Family Medicine | Admitting: Family Medicine

## 2024-04-07 ENCOUNTER — Encounter (HOSPITAL_BASED_OUTPATIENT_CLINIC_OR_DEPARTMENT_OTHER): Payer: Self-pay | Admitting: Emergency Medicine

## 2024-04-07 ENCOUNTER — Emergency Department (HOSPITAL_BASED_OUTPATIENT_CLINIC_OR_DEPARTMENT_OTHER): Admitting: Radiology

## 2024-04-07 ENCOUNTER — Other Ambulatory Visit: Payer: Self-pay

## 2024-04-07 ENCOUNTER — Emergency Department (HOSPITAL_BASED_OUTPATIENT_CLINIC_OR_DEPARTMENT_OTHER)

## 2024-04-07 DIAGNOSIS — R918 Other nonspecific abnormal finding of lung field: Secondary | ICD-10-CM | POA: Diagnosis not present

## 2024-04-07 DIAGNOSIS — Z87891 Personal history of nicotine dependence: Secondary | ICD-10-CM | POA: Diagnosis not present

## 2024-04-07 DIAGNOSIS — J9382 Other air leak: Secondary | ICD-10-CM | POA: Diagnosis not present

## 2024-04-07 DIAGNOSIS — F17210 Nicotine dependence, cigarettes, uncomplicated: Secondary | ICD-10-CM | POA: Diagnosis present

## 2024-04-07 DIAGNOSIS — D72829 Elevated white blood cell count, unspecified: Secondary | ICD-10-CM | POA: Diagnosis not present

## 2024-04-07 DIAGNOSIS — J948 Other specified pleural conditions: Principal | ICD-10-CM | POA: Diagnosis present

## 2024-04-07 DIAGNOSIS — J90 Pleural effusion, not elsewhere classified: Secondary | ICD-10-CM | POA: Diagnosis not present

## 2024-04-07 DIAGNOSIS — R739 Hyperglycemia, unspecified: Secondary | ICD-10-CM | POA: Diagnosis not present

## 2024-04-07 DIAGNOSIS — J9811 Atelectasis: Secondary | ICD-10-CM | POA: Diagnosis not present

## 2024-04-07 DIAGNOSIS — R748 Abnormal levels of other serum enzymes: Secondary | ICD-10-CM | POA: Diagnosis present

## 2024-04-07 DIAGNOSIS — Z4682 Encounter for fitting and adjustment of non-vascular catheter: Secondary | ICD-10-CM

## 2024-04-07 DIAGNOSIS — E611 Iron deficiency: Secondary | ICD-10-CM | POA: Diagnosis present

## 2024-04-07 DIAGNOSIS — R0789 Other chest pain: Secondary | ICD-10-CM | POA: Diagnosis not present

## 2024-04-07 DIAGNOSIS — J9311 Primary spontaneous pneumothorax: Secondary | ICD-10-CM | POA: Diagnosis not present

## 2024-04-07 DIAGNOSIS — D638 Anemia in other chronic diseases classified elsewhere: Secondary | ICD-10-CM | POA: Diagnosis not present

## 2024-04-07 DIAGNOSIS — J439 Emphysema, unspecified: Secondary | ICD-10-CM | POA: Diagnosis not present

## 2024-04-07 DIAGNOSIS — I700 Atherosclerosis of aorta: Secondary | ICD-10-CM | POA: Diagnosis not present

## 2024-04-07 DIAGNOSIS — J939 Pneumothorax, unspecified: Secondary | ICD-10-CM | POA: Diagnosis not present

## 2024-04-07 DIAGNOSIS — R079 Chest pain, unspecified: Secondary | ICD-10-CM | POA: Diagnosis present

## 2024-04-07 DIAGNOSIS — R9431 Abnormal electrocardiogram [ECG] [EKG]: Secondary | ICD-10-CM | POA: Diagnosis not present

## 2024-04-07 LAB — BODY FLUID CELL COUNT WITH DIFFERENTIAL
Eos, Fluid: 4 %
Lymphs, Fluid: 7 %
Monocyte-Macrophage-Serous Fluid: 12 % — ABNORMAL LOW (ref 50–90)
Neutrophil Count, Fluid: 77 % — ABNORMAL HIGH (ref 0–25)
Total Nucleated Cell Count, Fluid: 3685 uL — ABNORMAL HIGH (ref 0–1000)

## 2024-04-07 LAB — HEPATIC FUNCTION PANEL
ALT: 16 U/L (ref 0–44)
AST: 19 U/L (ref 15–41)
Albumin: 4.1 g/dL (ref 3.5–5.0)
Alkaline Phosphatase: 63 U/L (ref 38–126)
Bilirubin, Direct: 0.3 mg/dL — ABNORMAL HIGH (ref 0.0–0.2)
Indirect Bilirubin: 0.3 mg/dL (ref 0.3–0.9)
Total Bilirubin: 0.6 mg/dL (ref 0.0–1.2)
Total Protein: 7.7 g/dL (ref 6.5–8.1)

## 2024-04-07 LAB — PROTEIN, PLEURAL OR PERITONEAL FLUID: Total protein, fluid: 4.8 g/dL

## 2024-04-07 LAB — BASIC METABOLIC PANEL WITH GFR
Anion gap: 14 (ref 5–15)
BUN: 17 mg/dL (ref 8–23)
CO2: 22 mmol/L (ref 22–32)
Calcium: 9.3 mg/dL (ref 8.9–10.3)
Chloride: 102 mmol/L (ref 98–111)
Creatinine, Ser: 0.81 mg/dL (ref 0.61–1.24)
GFR, Estimated: >60 mL/min (ref >60)
Glucose, Bld: 155 mg/dL — ABNORMAL HIGH (ref 70–99)
Potassium: 3.8 mmol/L (ref 3.5–5.1)
Sodium: 138 mmol/L (ref 135–145)

## 2024-04-07 LAB — D-DIMER, QUANTITATIVE: D-Dimer, Quant: 0.42 ug{FEU}/mL (ref 0.00–0.50)

## 2024-04-07 LAB — CBC
HCT: 31.8 % — ABNORMAL LOW (ref 39.0–52.0)
Hemoglobin: 10.7 g/dL — ABNORMAL LOW (ref 13.0–17.0)
MCH: 28.8 pg (ref 26.0–34.0)
MCHC: 33.6 g/dL (ref 30.0–36.0)
MCV: 85.5 fL (ref 80.0–100.0)
Platelets: 298 K/uL (ref 150–400)
RBC: 3.72 MIL/uL — ABNORMAL LOW (ref 4.22–5.81)
RDW: 12.9 % (ref 11.5–15.5)
WBC: 12.2 K/uL — ABNORMAL HIGH (ref 4.0–10.5)
nRBC: 0 % (ref 0.0–0.2)

## 2024-04-07 LAB — TROPONIN T, HIGH SENSITIVITY
Troponin T High Sensitivity: <15 ng/L (ref <19)
Troponin T High Sensitivity: <15 ng/L (ref <19)

## 2024-04-07 LAB — LACTATE DEHYDROGENASE, PLEURAL OR PERITONEAL FLUID: LD, Fluid: 641 U/L — ABNORMAL HIGH (ref 3–23)

## 2024-04-07 LAB — LIPASE, BLOOD: Lipase: 72 U/L — ABNORMAL HIGH (ref 11–51)

## 2024-04-07 LAB — GLUCOSE, PLEURAL OR PERITONEAL FLUID: Glucose, Fluid: 63 mg/dL

## 2024-04-07 MED ORDER — KETAMINE HCL 10 MG/ML IJ SOLN
15.0000 mg | Freq: Once | INTRAMUSCULAR | Status: AC
  Administered 2024-04-07: 15 mg via INTRAVENOUS
  Filled 2024-04-07: qty 1

## 2024-04-07 MED ORDER — SODIUM CHLORIDE 0.9 % IV BOLUS
1000.0000 mL | Freq: Once | INTRAVENOUS | Status: AC
  Administered 2024-04-07: 1000 mL via INTRAVENOUS

## 2024-04-07 MED ORDER — OXYCODONE HCL 5 MG PO TABS
5.0000 mg | ORAL_TABLET | ORAL | Status: DC | PRN
  Administered 2024-04-07 – 2024-04-10 (×12): 5 mg via ORAL
  Filled 2024-04-07 (×12): qty 1

## 2024-04-07 MED ORDER — ACETAMINOPHEN 325 MG PO TABS
650.0000 mg | ORAL_TABLET | Freq: Four times a day (QID) | ORAL | Status: DC | PRN
  Administered 2024-04-08 – 2024-04-09 (×4): 650 mg via ORAL
  Filled 2024-04-07 (×4): qty 2

## 2024-04-07 MED ORDER — MIDAZOLAM HCL 2 MG/2ML IJ SOLN
2.0000 mg | Freq: Once | INTRAMUSCULAR | Status: AC | PRN
  Administered 2024-04-07: 2 mg via INTRAVENOUS
  Filled 2024-04-07: qty 2

## 2024-04-07 MED ORDER — FENTANYL CITRATE PF 50 MCG/ML IJ SOSY: 25.0000 ug | PREFILLED_SYRINGE | INTRAMUSCULAR | Status: DC | PRN

## 2024-04-07 MED ORDER — HYDROMORPHONE HCL 1 MG/ML IJ SOLN
0.5000 mg | Freq: Once | INTRAMUSCULAR | Status: AC
  Administered 2024-04-07: 0.5 mg via INTRAVENOUS
  Filled 2024-04-07: qty 1

## 2024-04-07 NOTE — H&P (Signed)
 History and Physical    Martin Bowman FMW:[id number] DOB: [birth year] DOA: 04/07/2024  PCP: Johnny Garnette LABOR, MD  Patient coming from: home  I have personally briefly reviewed patient's old medical records in [facility]  Chief Complaint: chest pain  HPI: Martin Bowman is Martin Bowman 68 y.o. male with medical history significant of tobacco abuse (quit 2 years ago) presenting after the development of chest pain.  He notes symptoms started Monday.  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  He started having Martin Bowman stabbing feeling throughout his R chest and back.  This was persistent throughout Monday and Monday Night.  He took some ibuprofen.  The pain improved Tuesday, but he didn't do much at all and stayed in bed all day.  He tried to get up to go to work this AM, but had no energy.  He was SOB with exertion.  Notes subjective fever and chills.  Notes nausea, no vomiting.  No appetite since Monday.  Cough, but nonproductive.  Quit smoking in April of 2022, but was heavy smoker prior.  No etoh.   ED Course: labs, imaging.  Chest tube.  Admit to hospitalist, CT surgery c/s.    Review of Systems: As per HPI otherwise all other systems reviewed and are negative.  Past Medical History:  Diagnosis Date   Urinary incontinence     Past Surgical History:  Procedure Laterality Date   COLONOSCOPY  05/19/2019   per Dr. Rollin, benign polyps, repeat in 10 yrs    left inguinal hernia repair and umbilical hernia repiar     Dr. mikell   TONSILLECTOMY     VASECTOMY     Dr. Mardy    Social History  reports that he has been smoking cigarettes. He started smoking about 50 years ago. He has Martin Rezabek 40 pack-year smoking history. He has never used smokeless tobacco. He reports that he does not drink alcohol and does not use drugs.  No Known Allergies  History reviewed. No pertinent family history.  Prior to Admission medications   Medication Sig Start Date End Date Taking? Authorizing Provider   Glucosamine 500 MG CAPS Take 1,500 mg by mouth daily at 12 noon.   Yes [provider]  ibuprofen (ADVIL) 200 MG tablet Take 800 mg by mouth as needed.   Yes [provider]  diazepam  (VALIUM ) 5 MG tablet Take 1 tablet (5 mg total) by mouth every 6 (six) hours as needed (vertigo). Patient not taking: Reported on 04/07/2024 04/10/23   Johnny Garnette LABOR, MD  Multiple Vitamin (MULTIVITAMIN) tablet Take 1 tablet by mouth daily.    [provider]    Physical Exam: Vitals:   04/07/24 1345 04/07/24 1400 04/07/24 1433 04/07/24 1633  BP:  (!) 164/81 (!) 176/84 (!) 144/77  Pulse: 97 100 (!) 105 87  Resp: (!) 27 (!) 23 19 (!) 22  Temp:    99.3 F (37.4 C)  TempSrc:    Oral  SpO2: 95% 95% 96% 96%  Weight:      Height:        Constitutional: NAD, calm, comfortable Vitals:   04/07/24 1345 04/07/24 1400 04/07/24 1433 04/07/24 1633  BP:  (!) 164/81 (!) 176/84 (!) 144/77  Pulse: 97 100 (!) 105 87  Resp: (!) 27 (!) 23 19 (!) 22  Temp:    99.3 F (37.4 C)  TempSrc:    Oral  SpO2: 95% 95% 96% 96%  Weight:  Height:       Eyes: PERRL, lids and conjunctivae normal ENMT: Mucous membranes are moist.  Neck: normal, supple Respiratory: diminished , unlabored - R sided chest tube Cardiovascular: RRR Abdomen: no tenderness, no masses palpated. No hepatosplenomegaly. Bowel sounds positive.  Musculoskeletal: no clubbing / cyanosis. No joint deformity upper and lower extremities. Good ROM, no contractures. Normal muscle tone.  Skin: no rashes, lesions, ulcers. No induration Neurologic: CN 2-12 grossly intact. Moving all extremities Psychiatric: Normal judgment and insight. Alert and oriented x 3. Normal mood.   Labs on Admission: I have personally reviewed following labs and imaging studies  CBC: Recent Labs  Lab 04/07/24 0710  WBC 12.2*  HGB 10.7*  HCT 31.8*  MCV 85.5  PLT 298    Basic Metabolic Panel: Recent Labs  Lab 04/07/24 0710  NA 138  K 3.8  CL 102   CO2 22  GLUCOSE 155*  BUN 17  CREATININE 0.81  CALCIUM 9.3    GFR: Estimated Creatinine Clearance: 85.9 mL/min (by C-G formula based on SCr of 0.81 mg/dL).  Liver Function Tests: Recent Labs  Lab 04/07/24 0710  AST 19  ALT 16  ALKPHOS 63  BILITOT 0.6  PROT 7.7  ALBUMIN 4.1    Urine analysis:    Component Value Date/Time   COLORURINE LT. YELLOW 01/23/2011 0736   APPEARANCEUR CLEAR 01/23/2011 0736   LABSPEC 1.020 01/23/2011 0736   PHURINE 7.0 01/23/2011 0736   GLUCOSEU NEGATIVE 01/23/2011 0736   HGBUR SMALL 01/23/2011 0736   HGBUR negative 01/10/2010 0908   BILIRUBINUR n 08/10/2018 1030   KETONESUR NEGATIVE 01/23/2011 0736   PROTEINUR Positive (Martin Bowman) 08/10/2018 1030   UROBILINOGEN 0.2 08/10/2018 1030   UROBILINOGEN 0.2 01/23/2011 0736   NITRITE n 08/10/2018 1030   NITRITE NEGATIVE 01/23/2011 0736   LEUKOCYTESUR Negative 08/10/2018 1030    Radiological Exams on Admission: DG Chest Port 1 View Result Date: 04/07/2024 CLINICAL DATA:  Status post chest tube placement. EXAM: PORTABLE CHEST 1 VIEW COMPARISON:  04/07/2024 FINDINGS: Interval placement of right-sided chest tube with evacuation of the right pleural gas. No residual pneumothorax evident. Right base collapse/consolidation with effusion again noted. Left lung clear. Heart size stable. Telemetry leads overlie the chest. IMPRESSION: 1. Interval placement of right-sided chest tube with evacuation of the right pleural gas. No residual pneumothorax evident. 2. Persistent right base collapse/consolidation with effusion. Electronically Signed   By: Camellia Candle M.D.   On: 04/07/2024 09:20   DG Chest 2 View Result Date: 04/07/2024 CLINICAL DATA:  Chest pain and coughing.  Symptom onset 2 days ago. EXAM: CHEST - 2 VIEW COMPARISON:  Chest PA Lat 03/30/2015 FINDINGS: There is Izea Livolsi right hydropneumothorax. The hydro component small to moderate in size extending just over the top of the hemidiaphragm. The pneumo component appears to  surround the right upper lobe. The middle and lower lobes are not well seen on PA due to the pleural fluid. Judging from the lateral view, the right middle lobe appears at least partially collapsed but the right lower lobe may remain aerated except where compressed by the effusion. Volume of pneumothorax is estimated at least 40% and possibly as much as 50%, without Eion Timbrook notable mediastinal shift. There is right perihilar linear atelectasis. The lungs are emphysematous and otherwise clear. Heart size and vasculature are normal except for calcification in the transverse aorta. There is Averianna Brugger stable mediastinal and mild aortic tortuosity. No new osseous abnormality. Thoracic spondylosis. IMPRESSION: 1. Right hydropneumothorax, with the hydro component  small to moderate in size and the pneumo component surrounding the right upper lobe. 2. The right middle lobe appears at least partially collapsed but the right lower lobe may remain aerated except where compressed by the effusion. 3. Volume of pneumothorax is estimated at least 40% and possibly as much as 50%, without Maicie Vanderloop notable mediastinal shift. 4. Emphysema. 5. Aortic atherosclerosis. 6. Right perihilar linear atelectasis. 7. Our PRA is attempting to reach the ordering physician for stat notification at time of signing. Electronically Signed   By: Francis Quam M.D.   On: 04/07/2024 07:41    EKG: Independently reviewed. Sinus tachycardia - LAD, new q waves in V2, V3? Borderline ST depression in V5, V6.  Assessment/Plan Principal Problem:   Hydropneumothorax    Assessment and Plan:  Hydropneumothorax S/p chest tube placement by the ED 7/23 Appreciate CT surgery evaluation  Thora labs - cytology, LDH, protein, glucose, cell count, culture pending - fluid looked grossly bloody  Pulmonary consult, appreciate assistance  Leukocytosis Noted, monitor - follow pending thora labs, fever curve Reported subjective fever/chills, low threshold to start  abx  Anemia Trend, follow  Abnormal EKG Negative troponins, D dimer Follow echo  Hyperglycemia Noted, follow A1c  Elevated Lipase No abdominal discomfort Unclear significance  Smoking History Quit in 2022, but significant prior hx     DVT prophylaxis: SCD  Code Status:   full  Family Communication:  Sig other at bedside  Disposition Plan:   Patient is from:  home  Anticipated DC to:  home  Anticipated DC date:  Pending improvement, clearance by CT surgery/pulm  Anticipated DC barriers: Pending improvement  Consults called:  CT surgery/pulmonology  Admission status:  inpatient   Severity of Illness: The appropriate patient status for this patient is INPATIENT. Inpatient status is judged to be reasonable and necessary in order to provide the required intensity of service to ensure the patient's safety. The patient's presenting symptoms, physical exam findings, and initial radiographic and laboratory data in the context of their chronic comorbidities is felt to place them at high risk for further clinical deterioration. Furthermore, it is not anticipated that the patient will be medically stable for discharge from the hospital within 2 midnights of admission.   * I certify that at the point of admission it is my clinical judgment that the patient will require inpatient hospital care spanning beyond 2 midnights from the point of admission due to high intensity of service, high risk for further deterioration and high frequency of surveillance required.DEWAINE Meliton Monte MD [facility]  How to contact the [facility] Attending or Consulting provider 7A - 7P or covering provider during after hours 7P -7A, for this patient?   Check the care team in [facility] and look for Cloria Ciresi) attending/consulting [facility] provider listed and b) the [facility] team listed Log into [email or web address] and use [place]'s universal password to access. If you do not have the password, please contact the hospital  operator. Locate the [facility] provider you are looking for under [facility] and page to Yen Wandell number that you can be directly reached. If you still have difficulty reaching the provider, please page the [facility] (Director on Call) for the Hospitalists listed on amion for assistance.  04/07/2024, 5:58 PM

## 2024-04-07 NOTE — ED Provider Notes (Signed)
 [place] EMERGENCY DEPARTMENT AT [facility] Provider Note   CSN: [id number] Arrival date & time: 04/07/24  9298     Patient presents with: Chest Pain   Martin Bowman is a 68 y.o. male.   68 yo M with a chief complaint of chest pain.  Going on for a couple days.  Occurred originally when he came home from work on Monday.  Pain is persistent since then.  Seems to be worse with exertion improves with rest.  Right-sided radiates around to the back.  Has had some diaphoresis with this.  No nausea or vomiting.  He was dizzy at times.  Has also been having some left ear pain.  Coughing a little bit as well.  Congested.  Patient denies history of MI, denies hypertension hyperlipidemia diabetes or smoking.  Denies family history of MI.  Patient denies history of PE or DVT denies hemoptysis denies unilateral lower extremity edema denies recent surgery immobilization hospitalization estrogen use or history of cancer.     Chest Pain      Prior to Admission medications   Medication Sig Start Date End Date Taking? Authorizing Provider  Glucosamine 500 MG CAPS Take 1,500 mg by mouth daily at 12 noon.   Yes [provider]  ibuprofen (ADVIL) 200 MG tablet Take 800 mg by mouth as needed.   Yes [provider]  diazepam  (VALIUM ) 5 MG tablet Take 1 tablet (5 mg total) by mouth every 6 (six) hours as needed (vertigo). Patient not taking: Reported on 04/07/2024 04/10/23   Johnny Garnette LABOR, MD  Multiple Vitamin (MULTIVITAMIN) tablet Take 1 tablet by mouth daily.    [provider]    Allergies: Patient has no known allergies.    Review of Systems  Cardiovascular:  Positive for chest pain.    Updated Vital Signs BP (!) 150/93   Pulse 85   Temp 98.4 F (36.9 C) (Oral)   Resp (!) 24   Ht 5' 8.5 (1.74 m)   Wt 72.6 kg   SpO2 100%   BMI 23.97 kg/m   Physical Exam Vitals and nursing note reviewed.  Constitutional:      Appearance: He is  well-developed.  HENT:     Head: Normocephalic and atraumatic.  Eyes:     Pupils: Pupils are equal, round, and reactive to light.  Neck:     Vascular: No JVD.  Cardiovascular:     Rate and Rhythm: Regular rhythm. Tachycardia present.     Heart sounds: No murmur heard.    No friction rub. No gallop.  Pulmonary:     Effort: No respiratory distress.     Breath sounds: No wheezing.  Abdominal:     General: There is no distension.     Tenderness: There is no abdominal tenderness. There is no guarding or rebound.  Musculoskeletal:        General: Normal range of motion.     Cervical back: Normal range of motion and neck supple.  Skin:    Coloration: Skin is not pale.     Findings: No Zylstra.  Neurological:     Mental Status: He is alert and oriented to person, place, and time.  Psychiatric:        Behavior: Behavior normal.     (all labs ordered are listed, but only abnormal results are displayed) Labs Reviewed  BASIC METABOLIC PANEL WITH GFR - Abnormal; Notable for the following components:      Result Value  Glucose, Bld 155 (*)    All other components within normal limits  CBC - Abnormal; Notable for the following components:   WBC 12.2 (*)    RBC 3.72 (*)    Hemoglobin 10.7 (*)    HCT 31.8 (*)    All other components within normal limits  HEPATIC FUNCTION PANEL - Abnormal; Notable for the following components:   Bilirubin, Direct 0.3 (*)    All other components within normal limits  LIPASE, BLOOD - Abnormal; Notable for the following components:   Lipase 72 (*)    All other components within normal limits  D-DIMER, QUANTITATIVE  TROPONIN T, HIGH SENSITIVITY  TROPONIN T, HIGH SENSITIVITY    EKG: EKG Interpretation Date/Time:  Wednesday April 07 2024 07:06:45 EDT Ventricular Rate:  107 PR Interval:  153 QRS Duration:  96 QT Interval:  327 QTC Calculation: 437 R Axis:   -42  Text Interpretation: Sinus tachycardia Left axis deviation Anterior infarct, old  Minimal ST depression, lateral leads No old tracing to compare Confirmed by Emil Share [phone]) on 04/07/2024 7:25:58 AM  Radiology: [facility] Chest Port 1 View Result Date: 04/07/2024 CLINICAL DATA:  Status post chest tube placement. EXAM: PORTABLE CHEST 1 VIEW COMPARISON:  04/07/2024 FINDINGS: Interval placement of right-sided chest tube with evacuation of the right pleural gas. No residual pneumothorax evident. Right base collapse/consolidation with effusion again noted. Left lung clear. Heart size stable. Telemetry leads overlie the chest. IMPRESSION: 1. Interval placement of right-sided chest tube with evacuation of the right pleural gas. No residual pneumothorax evident. 2. Persistent right base collapse/consolidation with effusion. Electronically Signed   By: Camellia Candle M.D.   On: 04/07/2024 09:20   DG Chest 2 View Result Date: 04/07/2024 CLINICAL DATA:  Chest pain and coughing.  Symptom onset 2 days ago. EXAM: CHEST - 2 VIEW COMPARISON:  Chest PA Lat 03/30/2015 FINDINGS: There is a right hydropneumothorax. The hydro component small to moderate in size extending just over the top of the hemidiaphragm. The pneumo component appears to surround the right upper lobe. The middle and lower lobes are not well seen on PA due to the pleural fluid. Judging from the lateral view, the right middle lobe appears at least partially collapsed but the right lower lobe may remain aerated except where compressed by the effusion. Volume of pneumothorax is estimated at least 40% and possibly as much as 50%, without a notable mediastinal shift. There is right perihilar linear atelectasis. The lungs are emphysematous and otherwise clear. Heart size and vasculature are normal except for calcification in the transverse aorta. There is a stable mediastinal and mild aortic tortuosity. No new osseous abnormality. Thoracic spondylosis. IMPRESSION: 1. Right hydropneumothorax, with the hydro component small to moderate in size and the  pneumo component surrounding the right upper lobe. 2. The right middle lobe appears at least partially collapsed but the right lower lobe may remain aerated except where compressed by the effusion. 3. Volume of pneumothorax is estimated at least 40% and possibly as much as 50%, without a notable mediastinal shift. 4. Emphysema. 5. Aortic atherosclerosis. 6. Right perihilar linear atelectasis. 7. Our PRA is attempting to reach the ordering physician for stat notification at time of signing. Electronically Signed   By: Francis Quam M.D.   On: 04/07/2024 07:41     CHEST TUBE INSERTION  Date/Time: 04/07/2024 9:05 AM  Performed by: Emil Share, DO Authorized by: Emil Share, DO   Consent:    Consent obtained:  Verbal  Consent given by:  Patient and spouse   Risks, benefits, and alternatives were discussed: yes     Risks discussed:  Bleeding, incomplete drainage, damage to surrounding structures and infection   Alternatives discussed:  No treatment Universal protocol:    Procedure explained and questions answered to patient or proxy's satisfaction: yes     Imaging studies available: yes     Immediately prior to procedure, a time out was called: yes     Patient identity confirmed:  Verbally with patient Pre-procedure details:    Skin preparation:  Chlorhexidine   Preparation: Patient was prepped and draped in the usual sterile fashion   Sedation:    Sedation type:  None Anesthesia:    Anesthesia method:  Local infiltration   Local anesthetic:  Lidocaine 2% WITH epi Procedure details:    Placement location:  R lateral   Scalpel size:  11   Tube size ([place]): 14.   Ultrasound guidance: no     Tension pneumothorax: no     Tube connected to:  Suction   Drainage characteristics:  Bloody   Suture material:  0 silk   Dressing:  Xeroform gauze and 4x4 sterile gauze Post-procedure details:    Post-insertion x-ray findings: tube in good position     Procedure completion:  Tolerated well, no  immediate complications .Critical Care  Performed by: Emil Share, DO Authorized by: Emil Share, DO   Critical care provider statement:    Critical care time (minutes):  35   Critical care time was exclusive of:  Separately billable procedures and treating other patients   Critical care was time spent personally by me on the following activities:  Development of treatment plan with patient or surrogate, discussions with consultants, evaluation of patient's response to treatment, examination of patient, ordering and review of laboratory studies, ordering and review of radiographic studies, ordering and performing treatments and interventions, pulse oximetry, re-evaluation of patient's condition and review of old charts   Care discussed with: admitting provider      Medications Ordered in the ED  sodium chloride  0.9 % bolus 1,000 mL (0 mLs Intravenous Stopped 04/07/24 0816)  ketamine  (KETALAR ) injection 15 mg (15 mg Intravenous Given 04/07/24 0817)  midazolam  (VERSED ) injection 2 mg (2 mg Intravenous Given 04/07/24 0826)                                    Medical Decision Making Amount and/or Complexity of Data Reviewed Labs: ordered. Radiology: ordered.  Risk Prescription drug management. Decision regarding hospitalization.   68 yo M with a chief complaint of chest pain.  Going on for a couple days.  Has some typical and atypical components to the history.  No pain currently.  No significant change in symptoms in the past 6 hours.  Will obtain chest x-ray blood work.  Mildly tachycardic on arrival obtain a D-dimer.  Chest x-ray independently interpreted by me with a right-sided pneumothorax.  I discussed this with the radiologist.  I discussed the case with Dr. Lucas, cardiothoracic surgery.  He independently reviewed the images and based on my history and physical exam recommended chest tube placement with a pigtail medical admission and CT surgery to consult formally.  Patient's  troponins negative.  D-dimer negative.  No acute anemia.  No significant electrolyte abnormalities.  Chest tube placed without issue.  Bloody drainage.  Postplacement x-ray with good placement on my independent interpretation.  Will discuss with medicine for admission.  The patients results and plan were reviewed and discussed.   Any x-rays performed were independently reviewed by myself.   Differential diagnosis were considered with the presenting HPI.  Medications  sodium chloride  0.9 % bolus 1,000 mL (0 mLs Intravenous Stopped 04/07/24 0816)  ketamine  (KETALAR ) injection 15 mg (15 mg Intravenous Given 04/07/24 0817)  midazolam  (VERSED ) injection 2 mg (2 mg Intravenous Given 04/07/24 0826)    Vitals:   04/07/24 0840 04/07/24 0850 04/07/24 1030 04/07/24 1115  BP: (!) 160/85 (!) 150/88 (!) 150/93   Pulse: 94 91 85   Resp: 20 (!) 23 (!) 24   Temp:    98.4 F (36.9 C)  TempSrc:    Oral  SpO2: 98% 100% 100%   Weight:      Height:        Final diagnoses:  Primary spontaneous pneumothorax    Admission/ observation were discussed with the admitting physician, patient and/or family and they are comfortable with the plan.       Final diagnoses:  Primary spontaneous pneumothorax    ED Discharge Orders     None          Emil Share, DO 04/07/24 1118

## 2024-04-07 NOTE — Consult Note (Signed)
 NAME:  Martin Bowman, MRN:  [id number], DOB:  Mar 28, 1956, LOS: 0 ADMISSION DATE:  04/07/2024, CONSULTATION DATE:  7/23 REFERRING MD:  Altha, CHIEF COMPLAINT:  chest tube   History of Present Illness:  Martin Bowman is a 68 year old gentleman with no previous pulmonary history who presented with 2 days of shortness of breath and right-sided chest pain. He began having chest pain suddenly after forcefully lifting something heavily, associated with a Valsalva maneuver.  The following day he was having pain and was unable to get out of bed.  He presented to the emergency department today was found of a hydropneumothorax.  He had a chest tube placed at bedside.  Bloody output came from the chest tube.  He was transferred to [facility] for admission. He had chills briefly 2 days this week, but otherwise has no recent infectious symptoms-no wheezing, sputum production, sinus congestion, fever. He has had some ear fullness that is not significant.  He occasionally takes Flonase for allergies, but his allergies have not been bad recently.  He is not on inhalers.  No previous history of lung disease.  He quit smoking in 2022 after 50 years x 2 packs/day.  No family history of lung disease. Cardiothoracic surgery and pulmonary critical care were consulted for evaluation.  No pleural fluid studies send at the time of chest tube insertion.  Pertinent  Medical History  Tobacco abuse-100 pack years, quit 2022  Significant Hospital Events: Including procedures, antibiotic start and stop dates in addition to other pertinent events   7/23 admission, chest tube placement  Interim History / Subjective:    Objective    Blood pressure (!) 144/77, pulse 87, temperature 99.3 F (37.4 C), temperature source Oral, resp. rate (!) 22, height 5' 8.5 (1.74 m), weight 72.6 kg, SpO2 96%.        Intake/Output Summary (Last 24 hours) at 04/07/2024 1646 Last data filed at 04/07/2024 1330 Gross per 24 hour  Intake 1000 ml   Output 1790 ml  Net -790 ml   Filed Weights   04/07/24 0731  Weight: 72.6 kg    Examination: General: Elderly man lying in bed no acute distress HENT: /AT, eyes anicteric Lungs: Breathing comfortably on room air, CTAB.  Chest tube without airleak, dark bloody output. Cardiovascular: S1-S2, regular rate and rhythm Abdomen: Soft, nontender Extremities: No significant peripheral edema Neuro: Awake, alert, answering questions appropriately  WBC 12.2 H/H 10.7/31.1  CXR personally reviewed> hydropneumothorax. Following chest tube placement, layering effusion with lung re-expansion.  CT lung screening 04/2023: centrilobular emphysema, dependent nonspecific GG opacities in bases. Mild airway thickening.   Resolved problem list   Assessment and Plan   R hydropneumothorax, spontaneous.  With degree of emphysema on CT scan, this could be secondary, but without recent COPD symptoms, this seems less likely.  His Valsalva when lifting something heavy he potentially could have done this.  No good explanation for bloody output from chest tube unless traumatic insertion. -Was only able to collect about 20 cc of fluid for studies- ideally check cell count-, culture, LDH, protein.  Unlikely we will be able to get cytology. -May be beneficial as long reexpanded to get repeat CT chest - AM CXR - Chest tube placed back to suction by cardiothoracic surgery, agreed the bed is high risk for clotting with bloody output -May benefit from bronchodilators  Anemia, progressed since 2024.  New since 2022 - May benefit from outpatient iron studies  Best Practice (right click and Reselect all  SmartList Selections daily)   Per primary  Labs   CBC: Recent Labs  Lab 04/07/24 0710  WBC 12.2*  HGB 10.7*  HCT 31.8*  MCV 85.5  PLT 298    Basic Metabolic Panel: Recent Labs  Lab 04/07/24 0710  NA 138  K 3.8  CL 102  CO2 22  GLUCOSE 155*  BUN 17  CREATININE 0.81  CALCIUM 9.3    GFR: Estimated Creatinine Clearance: 85.9 mL/min (by C-G formula based on SCr of 0.81 mg/dL). Recent Labs  Lab 04/07/24 0710  WBC 12.2*    Liver Function Tests: Recent Labs  Lab 04/07/24 0710  AST 19  ALT 16  ALKPHOS 63  BILITOT 0.6  PROT 7.7  ALBUMIN 4.1   Recent Labs  Lab 04/07/24 0710  LIPASE 72*   No results for input(s): AMMONIA in the last 168 hours.  ABG No results found for: PHART, PCO2ART, PO2ART, HCO3, TCO2, ACIDBASEDEF, O2SAT   Coagulation Profile: No results for input(s): INR, PROTIME in the last 168 hours.  Cardiac Enzymes: No results for input(s): CKTOTAL, CKMB, CKMBINDEX, TROPONINI in the last 168 hours.  HbA1C: Hgb A1c MFr Bld  Date/Time Value Ref Range Status  04/10/2023 08:35 AM 5.8 4.6 - 6.5 % Final    Comment:    Glycemic Control Guidelines for People with Diabetes:Non Diabetic:  <6%Goal of Therapy: <7%Additional Action Suggested:  >8%   12/31/2021 08:24 AM 5.9 4.6 - 6.5 % Final    Comment:    Glycemic Control Guidelines for People with Diabetes:Non Diabetic:  <6%Goal of Therapy: <7%Additional Action Suggested:  >8%     CBG: No results for input(s): GLUCAP in the last 168 hours.  Review of Systems:   Review of Systems  Constitutional:  Positive for chills. Negative for fever.  HENT:  Negative for congestion.   Respiratory:  Positive for shortness of breath. Negative for cough, hemoptysis, sputum production and wheezing.   Cardiovascular:  Negative for chest pain and leg swelling.  Gastrointestinal:  Negative for heartburn, nausea and vomiting.  Musculoskeletal: Negative.   Skin:  Negative for [place].  Neurological:  Negative for weakness.     Past Medical History:  He,  has a past medical history of Urinary incontinence.   Surgical History:   Past Surgical History:  Procedure Laterality Date   COLONOSCOPY  05/19/2019   per Dr. Rollin, benign polyps, repeat in 10 yrs    left inguinal hernia repair  and umbilical hernia repiar     Dr. mikell   TONSILLECTOMY     VASECTOMY     Dr. Mardy     Social History:   reports that he has been smoking cigarettes. He started smoking about 50 years ago. He has a 40 pack-year smoking history. He has never used smokeless tobacco. He reports that he does not drink alcohol and does not use drugs.   Family History:  His family history is not on file.   Allergies No Known Allergies   Home Medications  Prior to Admission medications   Medication Sig Start Date End Date Taking? Authorizing Provider  Glucosamine 500 MG CAPS Take 1,500 mg by mouth daily at 12 noon.   Yes [provider]  ibuprofen (ADVIL) 200 MG tablet Take 800 mg by mouth as needed.   Yes [provider]  diazepam  (VALIUM ) 5 MG tablet Take 1 tablet (5 mg total) by mouth every 6 (six) hours as needed (vertigo). Patient not taking: Reported on 04/07/2024 04/10/23  Johnny Garnette LABOR, MD  Multiple Vitamin (MULTIVITAMIN) tablet Take 1 tablet by mouth daily.    [provider]     Critical care time: n/a       Leita SHAUNNA Gaskins, DO 04/07/24 4:49 PM [facility]  For contact information, see Amion. If no response to pager, please call PCCM consult pager. After hours, 7PM- 7AM, please call Elink.

## 2024-04-07 NOTE — Progress Notes (Signed)
 Patient arrives to 5W10 at this time

## 2024-04-07 NOTE — Plan of Care (Signed)

## 2024-04-07 NOTE — ED Notes (Signed)
 Called Martin Bowman at [facility] for transport 14:08. She stated truck was already in route

## 2024-04-07 NOTE — ED Notes (Signed)
 Verbal order from EDP to switch pt from -20cm H2O suction to water seal only at this time. Pt chest tube switched to water seal. Pt currently in NAD.

## 2024-04-07 NOTE — ED Triage Notes (Signed)
 Pt caox4, ambulatory c/o intermittent CP and SOB since Monday morning, CP on exertion, also c/o nonproductive cough and L ear being clogged.

## 2024-04-07 NOTE — ED Notes (Signed)
 Dr. Emil to bedside at this time inserting chest tube

## 2024-04-07 NOTE — Consult Note (Addendum)
 [address]       [place] [address]             [phone]        Martin Bowman  Medical Record #[id number] Date of Birth: 1955-09-24  Referring: No ref. provider found Primary Care: Johnny Garnette LABOR, MD Primary Cardiologist:None  Chief Complaint:    Chief Complaint  Patient presents with   Chest Pain    History of Present Illness:     Martin Bowman is a 68 year old male with no significant past medical history but reports a 40 pack year smoking history having quit in 2022. The patient presented to the [facility] ED with complaints of intermittent right sided chest pain since Monday morning after he lifted a 100lb tool. The chest pain worsened over the last 2 days as well as shortness of breath, weakness, a nonproductive cough, chills, diaphoresis, dizziness, nausea and loss of appetite. He denies fevers and vomiting. He was found to be tachycardic and tachypneic. CXR on 07/23 showed a right hydropneumothorax with the hydro component small to moderate in size and the pneumothorax component is surrounding the right upper lobe, the right middle lobe appears partially collapsed and the right lower lobe remained aerated except in the area compressed by the effusion, the volume of pneumothorax is estimated at least 40-50% without mediastinal shift. Emphysema, aortic atherosclerosis, and right perihilar linear atelectasis was also noted. Chest tube was placed bedside in the ED and follow up CXR showed evacuation of right pleural gas without residual pneumothorax, and persistent right base collapse/consolidation with effusion. Chest tube was originally placed to - suction but was transitioned to water seal this afternoon. The patient was transferred to [facility] on 07/23 for further chest tube management. CT surgery has been consulted to assist with chest tube management.   Current Activity/ Functional Status: Patient is independent with  mobility/ambulation, transfers, ADL's, IADL's.   Zubrod Score: At the time of surgery this patient's most appropriate activity status/level should be described as: []     0    Normal activity, no symptoms [x]     1    Restricted in physical strenuous activity but ambulatory, able to do out light work []     2    Ambulatory and capable of self care, unable to do work activities, up and about                 more than 50%  Of the time                            []     3    Only limited self care, in bed greater than 50% of waking hours []     4    Completely disabled, no self care, confined to bed or chair []     5    Moribund  Past Medical History:  Diagnosis Date   Urinary incontinence     Past Surgical History:  Procedure Laterality Date   COLONOSCOPY  05/19/2019   per Dr. Rollin, benign polyps, repeat in 10 yrs    left inguinal hernia repair and umbilical hernia repiar     Dr. mikell   TONSILLECTOMY     VASECTOMY     Dr. Mardy    Social History   Tobacco Use  Smoking Status Every Day   Current packs/day: 0.00   Average packs/day: 1  pack/day for 40.0 years (40.0 ttl pk-yrs)   Types: Cigarettes   Start date: 05/19/1973   Last attempt to quit: 05/19/2013   Years since quitting: 10.8  Smokeless Tobacco Never    Social History   Substance and Sexual Activity  Alcohol Use No   Alcohol/week: 0.0 standard drinks of alcohol     No Known Allergies  No current facility-administered medications for this encounter.    Medications Prior to Admission  Medication Sig Dispense Refill Last Dose/Taking   Glucosamine 500 MG CAPS Take 1,500 mg by mouth daily at 12 noon.   04/04/2024   ibuprofen (ADVIL) 200 MG tablet Take 800 mg by mouth as needed.   04/06/2024   diazepam  (VALIUM ) 5 MG tablet Take 1 tablet (5 mg total) by mouth every 6 (six) hours as needed (vertigo). (Patient not taking: Reported on 04/07/2024) 60 tablet 0 Not Taking   Multiple Vitamin (MULTIVITAMIN) tablet Take 1 tablet  by mouth daily.   04/04/2024    History reviewed. No pertinent family history.   Review of Systems:   Review of Systems  Constitutional:  Positive for chills, diaphoresis and malaise/fatigue. Negative for fever and weight loss.  HENT:  Negative for hearing loss.   Eyes:  Negative for blurred vision.  Respiratory:  Positive for cough and shortness of breath. Negative for hemoptysis, sputum production and wheezing.        Dry cough  Cardiovascular:  Positive for chest pain. Negative for palpitations, orthopnea and leg swelling.       Right sided chest pain started Monday Feels like his pulse pauses at times  Gastrointestinal:  Positive for nausea. Negative for abdominal pain, blood in stool, constipation, diarrhea, heartburn, melena and vomiting.  Skin:  Negative for [place].  Neurological:  Positive for dizziness and weakness. Negative for seizures and loss of consciousness.  Endo/Heme/Allergies:  Bruises/bleeds easily.       Reports easy bruising    Physical Exam: BP (!) 144/77 (BP Location: Right Arm)   Pulse 87   Temp 99.3 F (37.4 C) (Oral)   Resp (!) 22   Ht 5' 8.5 (1.74 m)   Wt 72.6 kg   SpO2 96%   BMI 23.97 kg/m   General appearance: alert, cooperative, and no distress Head: Normocephalic, without obvious abnormality, atraumatic Neck: no adenopathy, no carotid bruit, no JVD, supple, symmetrical, trachea midline, and thyroid  not enlarged, symmetric, no tenderness/mass/nodules Lymph nodes: Cervical, supraclavicular, and axillary nodes normal. Resp: diminished right sided breath sounds, chest tube to water seal without air leak upon arrival Cardio: regular rate and rhythm, S1, S2 normal, no murmur, click, rub or gallop GI: soft, non-tender; bowel sounds normal; no masses,  no organomegaly Extremities: extremities normal, atraumatic, no cyanosis or edema Neurologic: Grossly normal  Diagnostic Studies & Radiology Findings:   CLINICAL DATA:  Chest pain and coughing.   Symptom onset 2 days ago.   EXAM: CHEST - 2 VIEW   COMPARISON:  Chest PA Lat 03/30/2015   FINDINGS: There is a right hydropneumothorax. The hydro component small to moderate in size extending just over the top of the hemidiaphragm.   The pneumo component appears to surround the right upper lobe. The middle and lower lobes are not well seen on PA due to the pleural fluid.   Judging from the lateral view, the right middle lobe appears at least partially collapsed but the right lower lobe may remain aerated except where compressed by the effusion.   Volume of pneumothorax is  estimated at least 40% and possibly as much as 50%, without a notable mediastinal shift.   There is right perihilar linear atelectasis. The lungs are emphysematous and otherwise clear.   Heart size and vasculature are normal except for calcification in the transverse aorta.   There is a stable mediastinal and mild aortic tortuosity. No new osseous abnormality. Thoracic spondylosis.   IMPRESSION: 1. Right hydropneumothorax, with the hydro component small to moderate in size and the pneumo component surrounding the right upper lobe. 2. The right middle lobe appears at least partially collapsed but the right lower lobe may remain aerated except where compressed by the effusion. 3. Volume of pneumothorax is estimated at least 40% and possibly as much as 50%, without a notable mediastinal shift. 4. Emphysema. 5. Aortic atherosclerosis. 6. Right perihilar linear atelectasis. 7. Our PRA is attempting to reach the ordering physician for stat notification at time of signing.     Electronically Signed   By: Francis Quam M.D.   On: 04/07/2024 07:41   CLINICAL DATA:  Status post chest tube placement.   EXAM: PORTABLE CHEST 1 VIEW   COMPARISON:  04/07/2024   FINDINGS: Interval placement of right-sided chest tube with evacuation of the right pleural gas. No residual pneumothorax evident. Right  base collapse/consolidation with effusion again noted. Left lung clear. Heart size stable. Telemetry leads overlie the chest.   IMPRESSION: 1. Interval placement of right-sided chest tube with evacuation of the right pleural gas. No residual pneumothorax evident. 2. Persistent right base collapse/consolidation with effusion.     Electronically Signed   By: Camellia Candle M.D.   On: 04/07/2024 09:20  Assessment & Plan: Right hydropneumothorax: This is the patient's first pneumothorax. Chest tube is in place to waterseal with no air leak. I placed chest tube back to - suction, plan to leave to suction today. Patient may disconnect suction to ambulate but should immediately replace suction. Since pneumothorax has resolved with chest tube and there is no air leak patient likely will not require surgery. Will continue conservative treatment with chest tube management.  Con GORMAN Bend, PA-C 04/07/24  I spent about 30 minutes with the patient face to face    Chart reviewed, patient examined, agree with above.  This 68 year old previous smoker developed right-sided chest pain and shortness of breath on Monday after lifting something heavy associated with a Valsalva maneuver.  He thought that he pulled a muscle.  He presented to [facility] ER today and a chest x-ray showed a large right hydropneumothorax.  The ER doctor called me this morning and asked him to place a pigtail catheter which he successfully did and the patient was transferred to [facility] for further management.  The patient reports that there was a large amount of drainage when the tube was inserted initially and it has continued to drain.  He feels much better.  Chest x-ray after insertion showed resolution of the pneumothorax with the pigtail catheter at the apex of the chest.  There is still some right pleural effusion and right lower lobe atelectasis/collapse.  I suspect that he most likely tore an adhesion in the chest  resulting in hemopneumothorax.  There is no active airleak.  I would continue the chest tube to 20 cm suction today and if his chest x-ray looks better tomorrow and there is no airleak he can go to waterseal.  I ordered him an incentive spirometer.  We will repeat the chest x-ray in the morning.  Dorise LOIS Fellers,  MD

## 2024-04-07 NOTE — ED Notes (Signed)
 NAD noted to patient at this time. Pt denies any new pain or difficulty breathing.

## 2024-04-08 ENCOUNTER — Inpatient Hospital Stay (HOSPITAL_COMMUNITY)

## 2024-04-08 DIAGNOSIS — J90 Pleural effusion, not elsewhere classified: Secondary | ICD-10-CM

## 2024-04-08 DIAGNOSIS — J948 Other specified pleural conditions: Secondary | ICD-10-CM

## 2024-04-08 DIAGNOSIS — R9431 Abnormal electrocardiogram [ECG] [EKG]: Secondary | ICD-10-CM

## 2024-04-08 LAB — COMPREHENSIVE METABOLIC PANEL WITH GFR
ALT: 17 U/L (ref 0–44)
AST: 17 U/L (ref 15–41)
Albumin: 2.9 g/dL — ABNORMAL LOW (ref 3.5–5.0)
Alkaline Phosphatase: 41 U/L (ref 38–126)
Anion gap: 10 (ref 5–15)
BUN: 17 mg/dL (ref 8–23)
CO2: 23 mmol/L (ref 22–32)
Calcium: 8.5 mg/dL — ABNORMAL LOW (ref 8.9–10.3)
Chloride: 103 mmol/L (ref 98–111)
Creatinine, Ser: 0.84 mg/dL (ref 0.61–1.24)
GFR, Estimated: >60 mL/min (ref >60)
Glucose, Bld: 113 mg/dL — ABNORMAL HIGH (ref 70–99)
Potassium: 4 mmol/L (ref 3.5–5.1)
Sodium: 136 mmol/L (ref 135–145)
Total Bilirubin: 0.6 mg/dL (ref 0.0–1.2)
Total Protein: 6.4 g/dL — ABNORMAL LOW (ref 6.5–8.1)

## 2024-04-08 LAB — CBC WITH DIFFERENTIAL/PLATELET
Abs Immature Granulocytes: 0.04 K/uL (ref 0.00–0.07)
Basophils Absolute: 0 K/uL (ref 0.0–0.1)
Basophils Relative: 0 %
Eosinophils Absolute: 0.1 K/uL (ref 0.0–0.5)
Eosinophils Relative: 1 %
HCT: 27.6 % — ABNORMAL LOW (ref 39.0–52.0)
Hemoglobin: 9.2 g/dL — ABNORMAL LOW (ref 13.0–17.0)
Immature Granulocytes: 1 %
Lymphocytes Relative: 16 %
Lymphs Abs: 1.4 K/uL (ref 0.7–4.0)
MCH: 28.5 pg (ref 26.0–34.0)
MCHC: 33.3 g/dL (ref 30.0–36.0)
MCV: 85.4 fL (ref 80.0–100.0)
Monocytes Absolute: 1 K/uL (ref 0.1–1.0)
Monocytes Relative: 11 %
Neutro Abs: 6.2 K/uL (ref 1.7–7.7)
Neutrophils Relative %: 71 %
Platelets: 259 K/uL (ref 150–400)
RBC: 3.23 MIL/uL — ABNORMAL LOW (ref 4.22–5.81)
RDW: 12.6 % (ref 11.5–15.5)
WBC: 8.8 K/uL (ref 4.0–10.5)
nRBC: 0 % (ref 0.0–0.2)

## 2024-04-08 LAB — ECHOCARDIOGRAM COMPLETE
AR max vel: 2.93 cm2
AV Area VTI: 3.15 cm2
AV Area mean vel: 3.01 cm2
AV Mean grad: 4.5 mmHg
AV Peak grad: 9.2 mmHg
Ao pk vel: 1.52 m/s
Area-P 1/2: 3.91 cm2
Height: 68.5 in
S' Lateral: 2.4 cm
Weight: 2560 [oz_av]

## 2024-04-08 LAB — FOLATE: Folate: 22.5 ng/mL (ref >5.9)

## 2024-04-08 LAB — IRON AND TIBC
Iron: 13 ug/dL — ABNORMAL LOW (ref 45–182)
Saturation Ratios: 6 % — ABNORMAL LOW (ref 17.9–39.5)
TIBC: 232 ug/dL — ABNORMAL LOW (ref 250–450)
UIBC: 219 ug/dL

## 2024-04-08 LAB — HEMOGLOBIN A1C
Hgb A1c MFr Bld: 5.4 % (ref 4.8–5.6)
Mean Plasma Glucose: 108.28 mg/dL

## 2024-04-08 LAB — VITAMIN B12: Vitamin B-12: 344 pg/mL (ref 180–914)

## 2024-04-08 LAB — HIV ANTIBODY (ROUTINE TESTING W REFLEX): HIV Screen 4th Generation wRfx: NONREACTIVE

## 2024-04-08 LAB — MAGNESIUM: Magnesium: 1.8 mg/dL (ref 1.7–2.4)

## 2024-04-08 LAB — FERRITIN: Ferritin: 329 ng/mL (ref 24–336)

## 2024-04-08 LAB — PHOSPHORUS: Phosphorus: 3.3 mg/dL (ref 2.5–4.6)

## 2024-04-08 MED ORDER — ALBUTEROL SULFATE (2.5 MG/3ML) 0.083% IN NEBU: 2.5000 mg | INHALATION_SOLUTION | RESPIRATORY_TRACT | Status: DC | PRN

## 2024-04-08 NOTE — Progress Notes (Addendum)
      [address]       [place] [address]             [phone]         Subjective: Patient reports he is still having pain but his appetite has improved. He reports the tube has stopped draining.  Objective: Vital signs in last 24 hours: Temp:  [98.3 F (36.8 C)-99.3 F (37.4 C)] 98.7 F (37.1 C) (07/24 0733) Pulse Rate:  [85-105] 87 (07/24 0400) Cardiac Rhythm: Normal sinus rhythm (07/24 0700) Resp:  [14-28] 14 (07/24 0400) BP: (109-176)/(63-100) 134/63 (07/24 0733) SpO2:  [91 %-100 %] 91 % (07/24 0400)  Hemodynamic parameters for last 24 hours:    Intake/Output from previous day: 07/23 0701 - 07/24 0700 In: 1000 [IV Piggyback:1000] Out: 1800 [Chest Tube:1800] Intake/Output this shift: No intake/output data recorded.  General appearance: alert, cooperative, and no distress Neurologic: intact Heart: regular rate and rhythm, S1, S2 normal, no murmur, click, rub or gallop Lungs: slightly diminished right basilar breath sounds improved from yesterday Abdomen: soft, non-tender; bowel sounds normal; no masses,  no organomegaly Extremities: extremities normal, atraumatic, no cyanosis or edema Wound: Clean and dry dressing in place  Lab Results: Recent Labs    04/07/24 0710 04/08/24 0421  WBC 12.2* 8.8  HGB 10.7* 9.2*  HCT 31.8* 27.6*  PLT 298 259   BMET:  Recent Labs    04/07/24 0710 04/08/24 0421  NA 138 136  K 3.8 4.0  CL 102 103  CO2 22 23  GLUCOSE 155* 113*  BUN 17 17  CREATININE 0.81 0.84  CALCIUM 9.3 8.5*    PT/INR: No results for input(s): LABPROT, INR in the last 72 hours. ABG No results found for: PHART, HCO3, TCO2, ACIDBASEDEF, O2SAT CBG (last 3)  No results for input(s): GLUCAP in the last 72 hours.  Assessment/Plan: S/P   CV: Stable VS, NSR  Pulm: CXR with no clear pneumothorax and effusion has improved. CT with 1800cc dark bloody drainage. Does look like drainage has slowed, pigtail flushed  without difficulty so does not seem to be clogged or clotted. No air leak this AM on suction. Will transition chest tube to water seal. Encourage IS and ambulation. Will get repeat CXR in the AM  Dispo: Continue chest tube to waterseal today. Will get repeat CXR in the AM.    LOS: 1 day    Con GORMAN Bend, PA-C 04/08/2024   Chart reviewed, patient examined, agree with above.  Minimal drainage from the tube today. 1100 cc in the [place]. CXR looked better with improved aeration of right lung and significant decrease in effusion and atelectasis. CT to water seal. Encouraged to continue ambulation and IS. If CXR ok in am will remove tube and he could go home later in the day. He will need followup with a CXR in a week or so.

## 2024-04-08 NOTE — Progress Notes (Signed)
 PROGRESS NOTE    Martin Bowman  FMW:[id number] DOB: 09-24-1955 DOA: 04/07/2024 PCP: Martin Bowman LABOR, MD  Chief Complaint  Patient presents with   Chest Pain    Brief Narrative:   Martin Bowman is Martin Bowman 68 y.o. male with medical history significant of tobacco abuse (quit 2 years ago) presenting after the development of chest pain.   Found to have hydropneumothorax, now s/p chest tube.   Assessment & Plan:   Principal Problem:   Hydropneumothorax  Hydropneumothorax Exudative Effusion S/p chest tube placement by the ED 7/23 Appreciate CT surgery evaluation  Thora labs - exudative - cytology (pending), LDH (elevated), protein (4.8), glucose (63), cell count (3685, 77% neutrophils), culture pending - fluid looked grossly bloody- suspected hemothorax - low concern for infection per pulmonology Pulmonary consult, appreciate assistance   Leukocytosis Resolved, follow  Reported subjective fever/chills, low threshold to start abx   Anemia Trend, follow Labs c/w iron def/aocd - follow    Abnormal EKG Negative troponins, D dimer Follow echo   Hyperglycemia Noted, follow A1c - 5.4   Elevated Lipase No abdominal discomfort Unclear significance   Smoking History Quit in 2022, but significant prior hx     DVT prophylaxis: SCD Code Status: full Family Communication: sig other at bedside Disposition:   Status is: Inpatient Remains inpatient appropriate because: need for chest tube management   Consultants:  CT surgery Pulmonary   Procedures:  Chest tube 7/23  Antimicrobials:  Anti-infectives (From admission, onward)    None       Subjective: No complaints today Discomfort related to tube  Objective: Vitals:   04/07/24 2314 04/08/24 0400 04/08/24 0733 04/08/24 1142  BP: (!) 141/76 109/65 134/63   Pulse: 88 87    Resp: 19 14    Temp: 98.3 F (36.8 C) 98.3 F (36.8 C) 98.7 F (37.1 C) 97.9 F (36.6 C)  TempSrc: Oral Oral Oral Oral  SpO2: 92% 91%     Weight:      Height:        Intake/Output Summary (Last 24 hours) at 04/08/2024 1352 Last data filed at 04/08/2024 0500 Gross per 24 hour  Intake --  Output 10 ml  Net -10 ml   Filed Weights   04/07/24 0731  Weight: 72.6 kg    Examination:  General exam: Appears calm and comfortable  Respiratory system: R chest tube, unlabored Cardiovascular system: RRR Gastrointestinal system: Abdomen is nondistended, soft and nontender.  Central nervous system: Alert and oriented. No focal neurological deficits. Extremities: no LEE   Data Reviewed: I have personally reviewed following labs and imaging studies  CBC: Recent Labs  Lab 04/07/24 0710 04/08/24 0421  WBC 12.2* 8.8  NEUTROABS  --  6.2  HGB 10.7* 9.2*  HCT 31.8* 27.6*  MCV 85.5 85.4  PLT 298 259    Basic Metabolic Panel: Recent Labs  Lab 04/07/24 0710 04/08/24 0421  NA 138 136  K 3.8 4.0  CL 102 103  CO2 22 23  GLUCOSE 155* 113*  BUN 17 17  CREATININE 0.81 0.84  CALCIUM 9.3 8.5*  MG  --  1.8  PHOS  --  3.3    GFR: Estimated Creatinine Clearance: 82.9 mL/min (by C-G formula based on SCr of 0.84 mg/dL).  Liver Function Tests: Recent Labs  Lab 04/07/24 0710 04/08/24 0421  AST 19 17  ALT 16 17  ALKPHOS 63 41  BILITOT 0.6 0.6  PROT 7.7 6.4*  ALBUMIN 4.1 2.9*  CBG: No results for input(s): GLUCAP in the last 168 hours.   Recent Results (from the past 240 hours)  Pleural fluid culture w Gram Stain     Status: None (Preliminary result)   Collection Time: 04/07/24  4:49 PM   Specimen: Pleural Fluid  Result Value Ref Range Status   Specimen Description PLEURAL  Final   Special Requests RIGHT  Final   Gram Stain   Final    RARE WBC PRESENT,BOTH PMN AND MONONUCLEAR NO ORGANISMS SEEN    Culture   Final    NO GROWTH < 24 HOURS Performed at [facility], [address], [place], [place] [address]    Report Status PENDING  Incomplete         Radiology Studies: DG CHEST PORT 1  VIEW Result Date: 04/08/2024 CLINICAL DATA:  Right hydropneumothorax, status post chest tube placement. EXAM: PORTABLE CHEST 1 VIEW COMPARISON:  04/07/2024 FINDINGS: Right pleural drainage catheter remains in place. Improved aeration at the right lung base although with some residual hazy opacity in the mid lung and right lung base likely representing combination of residual effusion and atelectasis. Continued obscuration of the right hemidiaphragm. Bandlike density favoring mild atelectasis along the left hemidiaphragm. Atherosclerotic calcification of the aortic arch. Tortuous thoracic aorta. IMPRESSION: 1. Improved aeration at the right lung base although with some residual hazy opacity in the mid lung and right lung base likely representing combination of residual effusion and atelectasis. 2. Right pleural drainage catheter remains in place. 3. Bandlike density favoring mild atelectasis along the left hemidiaphragm. 4. Aortic Atherosclerosis (ICD10-I70.0). Electronically Signed   By: Martin Bowman M.D.   On: 04/08/2024 10:18   DG Chest Port 1 View Result Date: 04/07/2024 CLINICAL DATA:  Status post chest tube placement. EXAM: PORTABLE CHEST 1 VIEW COMPARISON:  04/07/2024 FINDINGS: Interval placement of right-sided chest tube with evacuation of the right pleural gas. No residual pneumothorax evident. Right base collapse/consolidation with effusion again noted. Left lung clear. Heart size stable. Telemetry leads overlie the chest. IMPRESSION: 1. Interval placement of right-sided chest tube with evacuation of the right pleural gas. No residual pneumothorax evident. 2. Persistent right base collapse/consolidation with effusion. Electronically Signed   By: Martin Bowman M.D.   On: 04/07/2024 09:20   DG Chest 2 View Result Date: 04/07/2024 CLINICAL DATA:  Chest pain and coughing.  Symptom onset 2 days ago. EXAM: CHEST - 2 VIEW COMPARISON:  Chest PA Lat 03/30/2015 FINDINGS: There is Martin Bowman right  hydropneumothorax. The hydro component small to moderate in size extending just over the top of the hemidiaphragm. The pneumo component appears to surround the right upper lobe. The middle and lower lobes are not well seen on PA due to the pleural fluid. Judging from the lateral view, the right middle lobe appears at least partially collapsed but the right lower lobe may remain aerated except where compressed by the effusion. Volume of pneumothorax is estimated at least 40% and possibly as much as 50%, without Corrissa Martello notable mediastinal shift. There is right perihilar linear atelectasis. The lungs are emphysematous and otherwise clear. Heart size and vasculature are normal except for calcification in the transverse aorta. There is Aerielle Stoklosa stable mediastinal and mild aortic tortuosity. No new osseous abnormality. Thoracic spondylosis. IMPRESSION: 1. Right hydropneumothorax, with the hydro component small to moderate in size and the pneumo component surrounding the right upper lobe. 2. The right middle lobe appears at least partially collapsed but the right lower lobe may remain aerated  except where compressed by the effusion. 3. Volume of pneumothorax is estimated at least 40% and possibly as much as 50%, without Chalon Zobrist notable mediastinal shift. 4. Emphysema. 5. Aortic atherosclerosis. 6. Right perihilar linear atelectasis. 7. Our PRA is attempting to reach the ordering physician for stat notification at time of signing. Electronically Signed   By: Francis Quam M.D.   On: 04/07/2024 07:41        Scheduled Meds: Continuous Infusions:   LOS: 1 day    Time spent: over 30 min    Meliton Monte, MD [facility]   To contact the attending provider between 7A-7P or the covering provider during after hours 7P-7A, please log into the web site [email or web address] and access using universal  password for that web site. If you do not have the password, please call the hospital operator.  04/08/2024, 1:52 PM

## 2024-04-08 NOTE — Progress Notes (Signed)
*  PRELIMINARY RESULTS* Echocardiogram 2D Echocardiogram has been performed.  Martin Bowman Stallion 04/08/2024, 8:53 AM

## 2024-04-08 NOTE — Plan of Care (Signed)

## 2024-04-08 NOTE — Progress Notes (Signed)
 NAME:  Martin Bowman, MRN:  [id number], DOB:  25-Jun-1956, LOS: 1 ADMISSION DATE:  04/07/2024, CONSULTATION DATE:  7/23 REFERRING MD:  Altha, CHIEF COMPLAINT:  chest tube   History of Present Illness:  Martin Bowman is a 68 year old gentleman with no previous pulmonary history who presented with 2 days of shortness of breath and right-sided chest pain. He began having chest pain suddenly after forcefully lifting something heavily, associated with a Valsalva maneuver.  The following day he was having pain and was unable to get out of bed.  He presented to the emergency department today was found of a hydropneumothorax.  He had a chest tube placed at bedside.  Bloody output came from the chest tube.  He was transferred to [facility] for admission. He had chills briefly 2 days this week, but otherwise has no recent infectious symptoms-no wheezing, sputum production, sinus congestion, fever. He has had some ear fullness that is not significant.  He occasionally takes Flonase for allergies, but his allergies have not been bad recently.  He is not on inhalers.  No previous history of lung disease.  He quit smoking in 2022 after 50 years x 2 packs/day.  No family history of lung disease. Cardiothoracic surgery and pulmonary critical care were consulted for evaluation.  No pleural fluid studies send at the time of chest tube insertion.  Pertinent  Medical History  Tobacco abuse-100 pack years, quit 2022  Significant Hospital Events: Including procedures, antibiotic start and stop dates in addition to other pertinent events   7/23 admission, chest tube placement  Interim History / Subjective:  Overnight no new complaints  Objective    Blood pressure 134/63, pulse 87, temperature 98.7 F (37.1 C), temperature source Oral, resp. rate 14, height 5' 8.5 (1.74 m), weight 72.6 kg, SpO2 91%.        Intake/Output Summary (Last 24 hours) at 04/08/2024 0933 Last data filed at 04/08/2024 0500 Gross per 24 hour   Intake --  Output 1800 ml  Net -1800 ml   Filed Weights   04/07/24 0731  Weight: 72.6 kg    Examination: General: Elderly man lying in bed no acute distress HENT: [place]/AT, eyes anicteric Lungs: Breathing comfortably on room air.  No airleak from chest tube.  Continues to have bloody output from chest tube. Cardiovascular: S1-S2, regular rate and rhythm Abdomen: Soft, nontender Extremities: No edema or cyanosis Neuro: Awake, alert, answering questions appropriately  WBC 8.8 Pleural fluid LDH 641 Protein 4.8 WBC 3685, 77% neutrophils Gram stain rare WBCs, no organisms seen  CXR personally reviewed> reexpanded right lung, chest tube positioned apically.  Resolving layering right dependent effusion.  Resolved problem list   Assessment and Plan   R hydropneumothorax, spontaneous.  Agree that this is likely precipitated by Valsalva maneuver and changes in chest pressure.  Torn lung adhesion seems likely. Inflammatory effusion, mostly bloody.  No concern for infection with overall low cellularity. - TC TS hospice chest tubes to waterseal.  They will continue to manage the chest tube.  PCCM will sign off.  Please call with questions.   Best Practice (right click and Reselect all SmartList Selections daily)   Per primary  Labs   CBC: Recent Labs  Lab 04/07/24 0710 04/08/24 0421  WBC 12.2* 8.8  NEUTROABS  --  6.2  HGB 10.7* 9.2*  HCT 31.8* 27.6*  MCV 85.5 85.4  PLT 298 259    Basic Metabolic Panel: Recent Labs  Lab 04/07/24 0710 04/08/24 0421  NA 138 136  K 3.8 4.0  CL 102 103  CO2 22 23  GLUCOSE 155* 113*  BUN 17 17  CREATININE 0.81 0.84  CALCIUM 9.3 8.5*  MG  --  1.8  PHOS  --  3.3    Critical care time: n/a       Leita SHAUNNA Gaskins, DO 04/08/24 9:37 AM [facility]  For contact information, see Amion. If no response to pager, please call PCCM consult pager. After hours, 7PM- 7AM, please call Elink.

## 2024-04-09 ENCOUNTER — Inpatient Hospital Stay (HOSPITAL_COMMUNITY)

## 2024-04-09 DIAGNOSIS — J948 Other specified pleural conditions: Secondary | ICD-10-CM | POA: Diagnosis not present

## 2024-04-09 DIAGNOSIS — J9382 Other air leak: Secondary | ICD-10-CM

## 2024-04-09 LAB — BASIC METABOLIC PANEL WITH GFR
Anion gap: 10 (ref 5–15)
BUN: 16 mg/dL (ref 8–23)
CO2: 22 mmol/L (ref 22–32)
Calcium: 8.4 mg/dL — ABNORMAL LOW (ref 8.9–10.3)
Chloride: 104 mmol/L (ref 98–111)
Creatinine, Ser: 0.74 mg/dL (ref 0.61–1.24)
GFR, Estimated: >60 mL/min (ref >60)
Glucose, Bld: 109 mg/dL — ABNORMAL HIGH (ref 70–99)
Potassium: 4 mmol/L (ref 3.5–5.1)
Sodium: 136 mmol/L (ref 135–145)

## 2024-04-09 LAB — CBC
HCT: 27.1 % — ABNORMAL LOW (ref 39.0–52.0)
Hemoglobin: 9.1 g/dL — ABNORMAL LOW (ref 13.0–17.0)
MCH: 28.6 pg (ref 26.0–34.0)
MCHC: 33.6 g/dL (ref 30.0–36.0)
MCV: 85.2 fL (ref 80.0–100.0)
Platelets: 269 K/uL (ref 150–400)
RBC: 3.18 MIL/uL — ABNORMAL LOW (ref 4.22–5.81)
RDW: 12.4 % (ref 11.5–15.5)
WBC: 9.2 K/uL (ref 4.0–10.5)
nRBC: 0 % (ref 0.0–0.2)

## 2024-04-09 LAB — MAGNESIUM: Magnesium: 1.9 mg/dL (ref 1.7–2.4)

## 2024-04-09 LAB — PHOSPHORUS: Phosphorus: 3.4 mg/dL (ref 2.5–4.6)

## 2024-04-09 MED ORDER — ENSURE PLUS HIGH PROTEIN PO LIQD
237.0000 mL | Freq: Two times a day (BID) | ORAL | Status: DC
  Administered 2024-04-09 – 2024-04-10 (×3): 237 mL via ORAL

## 2024-04-09 NOTE — Care Management Important Message (Signed)
 Important Message  Patient Details  Name: Martin Bowman MRN: [id number] Date of Birth: [birth year]   Important Message Given:  Yes - Medicare IM     Claretta Deed 04/09/2024, 4:24 PM

## 2024-04-09 NOTE — Progress Notes (Signed)
 PROGRESS NOTE    Martin Bowman  FMW:[id number] DOB: Jun 17, 1956 DOA: 04/07/2024 PCP: Johnny Garnette LABOR, MD  Chief Complaint  Patient presents with   Chest Pain    Brief Narrative:   Martin Bowman is Martin Bowman 68 y.o. male with medical history significant of tobacco abuse (quit 2 years ago) presenting after the development of chest pain.   Found to have hydropneumothorax, now s/p chest tube.   Assessment & Plan:   Principal Problem:   Hydropneumothorax  Hydropneumothorax Exudative Effusion S/p chest tube placement by the ED 7/23 CXR small R apical pneumothorax with chest tube in place, layering small R effusion Appreciate CT surgery evaluation - planning for repeat CXR in AM, possible d/c chest tube tmrw if stable Thora labs - exudative - cytology (pending), LDH (elevated), protein (4.8), glucose (63), cell count (3685, 77% neutrophils), culture pending - fluid looked grossly bloody- suspected hemothorax - low concern for infection per pulmonology Pulmonary consult, now signed off   Leukocytosis Resolved, follow  Reported subjective fever/chills, low threshold to start abx   Anemia Trend, follow Labs c/w iron def/aocd - follow    Abnormal EKG Negative troponins, D dimer Follow echo   Hyperglycemia Noted, follow A1c - 5.4   Elevated Lipase No abdominal discomfort Unclear significance   Smoking History Quit in 2022, but significant prior hx     DVT prophylaxis: SCD Code Status: full Family Communication: sig other at bedside Disposition:   Status is: Inpatient Remains inpatient appropriate because: need for chest tube management   Consultants:  CT surgery Pulmonary   Procedures:  Chest tube 7/23  Antimicrobials:  Anti-infectives (From admission, onward)    None       Subjective: No new complaints  Objective: Vitals:   04/09/24 0000 04/09/24 0200 04/09/24 0400 04/09/24 0834  BP: 119/67  123/63 117/61  Pulse: 81 86 91 84  Resp: 18 (!) 23 (!) 22 17   Temp: 99.5 F (37.5 C)  99 F (37.2 C) (!) 97.5 F (36.4 C)  TempSrc: Oral  Oral Oral  SpO2: 92% 95% 91% 93%  Weight:      Height:        Intake/Output Summary (Last 24 hours) at 04/09/2024 1416 Last data filed at 04/09/2024 0835 Gross per 24 hour  Intake --  Output 920 ml  Net -920 ml   Filed Weights   04/07/24 0731  Weight: 72.6 kg    Examination:  General: No acute distress. Cardiovascular: RRR Lungs: R sided chest tube, unlabored Neurological: Alert and oriented 3. Moves all extremities 4 with equal strength. Cranial nerves II through XII grossly intact. Extremities: No clubbing or cyanosis. No edema.   Data Reviewed: I have personally reviewed following labs and imaging studies  CBC: Recent Labs  Lab 04/07/24 0710 04/08/24 0421 04/09/24 0555  WBC 12.2* 8.8 9.2  NEUTROABS  --  6.2  --   HGB 10.7* 9.2* 9.1*  HCT 31.8* 27.6* 27.1*  MCV 85.5 85.4 85.2  PLT 298 259 269    Basic Metabolic Panel: Recent Labs  Lab 04/07/24 0710 04/08/24 0421 04/09/24 0555  NA 138 136 136  K 3.8 4.0 4.0  CL 102 103 104  CO2 22 23 22   GLUCOSE 155* 113* 109*  BUN 17 17 16   CREATININE 0.81 0.84 0.74  CALCIUM 9.3 8.5* 8.4*  MG  --  1.8 1.9  PHOS  --  3.3 3.4    GFR: Estimated Creatinine Clearance: 87 mL/min (by C-G formula  based on SCr of 0.74 mg/dL).  Liver Function Tests: Recent Labs  Lab 04/07/24 0710 04/08/24 0421  AST 19 17  ALT 16 17  ALKPHOS 63 41  BILITOT 0.6 0.6  PROT 7.7 6.4*  ALBUMIN 4.1 2.9*    CBG: No results for input(s): GLUCAP in the last 168 hours.   Recent Results (from the past 240 hours)  Pleural fluid culture w Gram Stain     Status: None (Preliminary result)   Collection Time: 04/07/24  4:49 PM   Specimen: Pleural Fluid  Result Value Ref Range Status   Specimen Description PLEURAL  Final   Special Requests RIGHT  Final   Gram Stain   Final    RARE WBC PRESENT,BOTH PMN AND MONONUCLEAR NO ORGANISMS SEEN    Culture   Final     NO GROWTH 2 DAYS Performed at [facility], [address], [place], [place] [address]    Report Status PENDING  Incomplete         Radiology Studies: DG Chest Port 1 View Result Date: 04/09/2024 CLINICAL DATA:  [id number] Pneumothorax, right [phone] EXAM: PORTABLE CHEST - 1 VIEW COMPARISON:  04/08/2024 FINDINGS: Stable right chest tube directed medially toward the apex. Small apical pneumothorax less than 10%. Layering hazy opacity of the right hemithorax suggesting layering small effusion. Mild patchy opacities in the lung bases stable. Heart size and mediastinal contours are within normal limits. Aortic Atherosclerosis (ICD10-170.0). Visualized bones unremarkable. IMPRESSION: 1. Small right apical pneumothorax with chest tube in place. 2. Layering small right effusion. Electronically Signed   By: JONETTA Faes M.D.   On: 04/09/2024 10:11   ECHOCARDIOGRAM COMPLETE Result Date: 04/08/2024    ECHOCARDIOGRAM REPORT   Patient Name:   Martin Bowman Date of Exam: 04/08/2024 Medical Rec #:  [id number]      Height:       68.5 in Accession #:    [id number]     Weight:       160.0 lb Date of Birth:  [birth year]      BSA:          1.869 m Patient Age:    68 years       BP:           134/63 mmHg Patient Gender: M              HR:           83 bpm. Exam Location:  Inpatient Procedure: 2D Echo, Color Doppler and Cardiac Doppler (Both Spectral and Color            Flow Doppler were utilized during procedure). Indications:    Abnormal ECG  History:        Patient has no prior history of Echocardiogram examinations.  Sonographer:    Benard Stallion Referring Phys: [phone] Vicke Plotner CALDWELL POWELL JR IMPRESSIONS  1. Left ventricular ejection fraction, by estimation, is 60 to 65%. The left ventricle has normal function. The left ventricle has no regional wall motion abnormalities. Left ventricular diastolic parameters were normal.  2. Right ventricular systolic function is normal. The right ventricular size is normal. There  is normal pulmonary artery systolic pressure.  3. The mitral valve is normal in structure. No evidence of mitral valve regurgitation. No evidence of mitral stenosis.  4. The aortic valve is normal in structure. Aortic valve regurgitation is not visualized. Aortic valve sclerosis is present, with no evidence of aortic valve stenosis.  5. The inferior vena  cava is normal in size with greater than 50% respiratory variability, suggesting right atrial pressure of 3 mmHg. FINDINGS  Left Ventricle: Left ventricular ejection fraction, by estimation, is 60 to 65%. The left ventricle has normal function. The left ventricle has no regional wall motion abnormalities. The left ventricular internal cavity size was normal in size. There is  no left ventricular hypertrophy. Left ventricular diastolic parameters were normal. Right Ventricle: The right ventricular size is normal. No increase in right ventricular wall thickness. Right ventricular systolic function is normal. There is normal pulmonary artery systolic pressure. The tricuspid regurgitant velocity is 2.47 m/s, and  with an assumed right atrial pressure of 3 mmHg, the estimated right ventricular systolic pressure is 27.4 mmHg. Left Atrium: Left atrial size was normal in size. Right Atrium: Right atrial size was normal in size. Pericardium: There is no evidence of pericardial effusion. Mitral Valve: The mitral valve is normal in structure. No evidence of mitral valve regurgitation. No evidence of mitral valve stenosis. Tricuspid Valve: The tricuspid valve is normal in structure. Tricuspid valve regurgitation is mild . No evidence of tricuspid stenosis. Aortic Valve: The aortic valve is normal in structure. Aortic valve regurgitation is not visualized. Aortic valve sclerosis is present, with no evidence of aortic valve stenosis. Aortic valve mean gradient measures 4.5 mmHg. Aortic valve peak gradient measures 9.2 mmHg. Aortic valve area, by VTI measures 3.15 cm. Pulmonic  Valve: The pulmonic valve was normal in structure. Pulmonic valve regurgitation is not visualized. No evidence of pulmonic stenosis. Aorta: The aortic root is normal in size and structure. Venous: The inferior vena cava is normal in size with greater than 50% respiratory variability, suggesting right atrial pressure of 3 mmHg. IAS/Shunts: No atrial level shunt detected by color flow Doppler.  LEFT VENTRICLE PLAX 2D LVIDd:         4.50 cm   Diastology LVIDs:         2.40 cm   LV e' medial:    6.31 cm/s LV PW:         1.00 cm   LV E/e' medial:  10.4 LV IVS:        1.00 cm   LV e' lateral:   10.20 cm/s LVOT diam:     2.30 cm   LV E/e' lateral: 6.4 LV SV:         84 LV SV Index:   45 LVOT Area:     4.15 cm  RIGHT VENTRICLE RV Basal diam:  3.50 cm RV Mid diam:    3.70 cm RV S prime:     16.60 cm/s TAPSE (M-mode): 2.2 cm LEFT ATRIUM             Index        RIGHT ATRIUM           Index LA diam:        4.00 cm 2.14 cm/m   RA Area:     18.50 cm LA Vol (A2C):   55.5 ml 29.70 ml/m  RA Volume:   48.20 ml  25.79 ml/m LA Vol (A4C):   44.8 ml 23.97 ml/m LA Biplane Vol: 50.2 ml 26.86 ml/m  AORTIC VALVE AV Area (Vmax):    2.93 cm AV Area (Vmean):   3.01 cm AV Area (VTI):     3.15 cm AV Vmax:           151.50 cm/s AV Vmean:          101.850 cm/s AV  VTI:            0.266 m AV Peak Grad:      9.2 mmHg AV Mean Grad:      4.5 mmHg LVOT Vmax:         107.00 cm/s LVOT Vmean:        73.700 cm/s LVOT VTI:          0.201 m LVOT/AV VTI ratio: 0.76  AORTA Ao Root diam: 4.00 cm Ao Asc diam:  3.70 cm MITRAL VALVE               TRICUSPID VALVE MV Area (PHT): 3.91 cm    TR Peak grad:   24.4 mmHg MV Decel Time: 194 msec    TR Vmax:        247.00 cm/s MV E velocity: 65.60 cm/s MV Judah Chevere velocity: 73.10 cm/s  SHUNTS MV E/Hatice Bubel ratio:  0.90        Systemic VTI:  0.20 m                            Systemic Diam: 2.30 cm Morene Brownie Electronically signed by Morene Brownie Signature Date/Time: 04/08/2024/2:55:22 PM    Final    DG CHEST PORT 1  VIEW Result Date: 04/08/2024 CLINICAL DATA:  Right hydropneumothorax, status post chest tube placement. EXAM: PORTABLE CHEST 1 VIEW COMPARISON:  04/07/2024 FINDINGS: Right pleural drainage catheter remains in place. Improved aeration at the right lung base although with some residual hazy opacity in the mid lung and right lung base likely representing combination of residual effusion and atelectasis. Continued obscuration of the right hemidiaphragm. Bandlike density favoring mild atelectasis along the left hemidiaphragm. Atherosclerotic calcification of the aortic arch. Tortuous thoracic aorta. IMPRESSION: 1. Improved aeration at the right lung base although with some residual hazy opacity in the mid lung and right lung base likely representing combination of residual effusion and atelectasis. 2. Right pleural drainage catheter remains in place. 3. Bandlike density favoring mild atelectasis along the left hemidiaphragm. 4. Aortic Atherosclerosis (ICD10-I70.0). Electronically Signed   By: Ryan Salvage M.D.   On: 04/08/2024 10:18        Scheduled Meds: Continuous Infusions:   LOS: 2 days    Time spent: over 30 min    Meliton Monte, MD [facility]   To contact the attending provider between 7A-7P or the covering provider during after hours 7P-7A, please log into the web site [email or web address] and access using universal [place] password for that web site. If you do not have the password, please call the hospital operator.  04/09/2024, 2:16 PM

## 2024-04-09 NOTE — Progress Notes (Addendum)
      [address] [place] [address]             [phone]         Subjective: Patient continues to complain of right sided chest pain, denies shortness of breath.  Objective: Vital signs in last 24 hours: Temp:  [97.9 F (36.6 C)-99.5 F (37.5 C)] 99 F (37.2 C) (07/25 0400) Pulse Rate:  [79-98] 91 (07/25 0400) Cardiac Rhythm: Normal sinus rhythm (07/25 0800) Resp:  [13-23] 22 (07/25 0400) BP: (118-142)/(53-67) 123/63 (07/25 0400) SpO2:  [90 %-95 %] 91 % (07/25 0400)  Hemodynamic parameters for last 24 hours:    Intake/Output from previous day: 07/24 0701 - 07/25 0700 In: -  Out: 720 [Urine:550; Chest Tube:170] Intake/Output this shift: No intake/output data recorded.  General appearance: alert, cooperative, and no distress Neurologic: intact Heart: regular rate and rhythm, S1, S2 normal, no murmur, click, rub or gallop Lungs: slightly diminished right apical and right basilar breath sounds Wound: Clean and dry dressing in place  Lab Results: Recent Labs    04/08/24 0421 04/09/24 0555  WBC 8.8 9.2  HGB 9.2* 9.1*  HCT 27.6* 27.1*  PLT 259 269   BMET:  Recent Labs    04/08/24 0421 04/09/24 0555  NA 136 136  K 4.0 4.0  CL 103 104  CO2 23 22  GLUCOSE 113* 109*  BUN 17 16  CREATININE 0.84 0.74  CALCIUM 8.5* 8.4*    PT/INR: No results for input(s): LABPROT, INR in the last 72 hours. ABG No results found for: PHART, HCO3, TCO2, ACIDBASEDEF, O2SAT CBG (last 3)  No results for input(s): GLUCAP in the last 72 hours.  Assessment/Plan: S/P pigtail placement  CV: Stable VS, NSR   Pulm: CXR with new tiny apical space and effusion is stable. CT with 170cc dark bloody drainage, has slowed quite a bit. Small intermittent air leak this AM with CT on waterseal. Will leave chest tube to water seal today. Encourage IS and ambulation. Will get repeat CXR in the AM   Dispo: Continue chest tube to waterseal today. Will  get repeat CXR in the AM. Hopefully if tiny pneumothorax remains stable and air leak resolves can d/c chest tube tomorrow.    LOS: 2 days    Con GORMAN [place], PA-C 04/09/2024   Chart reviewed, patient examined, agree with above.  His CXR this am looks fine. May be tiny apical ptx but insignificant. I don't see any air leak with coughing. He is still draining a small amount of old dark bloody fluid. Agree with keeping chest tube to water seal today and check CXR in am.

## 2024-04-09 NOTE — Care Management (Signed)
 Transition of Care [facility]) - Inpatient Brief Assessment   Patient Details  Name: Martin Bowman MRN: [id number] Date of Birth: 1956-06-14  Transition of Care [facility]) CM/SW Contact:    Corean JAYSON Canary, RN Phone Number: 04/09/2024, 10:27 AM   Clinical Narrative: Patient presented with hydropneumothorax. Chest tube in place. Previously independent  working.  No needs identified at this time, the patient will be discussed in daily  interdisciplinary progressive rounds. If a need is identified, please place a TOC consult     Transition of Care Asessment: Insurance and Status: Insurance coverage has been reviewed Patient has primary care physician: Yes Home environment has been reviewed: Works as a Theme park manager of function:: Counsellor: No current home services Social Drivers of Health Review: SDOH reviewed no interventions necessary Readmission risk has been reviewed: Yes Transition of care needs: no transition of care needs at this time

## 2024-04-09 NOTE — Progress Notes (Signed)
   04/09/24 1409  Mobility  Activity Ambulated independently in hallway  Level of Assistance Independent  Assistive Device None  Distance Ambulated (ft) 600 ft  Activity Response Tolerated fair  Mobility Referral Yes  Mobility visit 1 Mobility  Mobility Specialist Start Time (ACUTE ONLY) 1409  Mobility Specialist Stop Time (ACUTE ONLY) 1419  Mobility Specialist Time Calculation (min) (ACUTE ONLY) 10 min   Mobility Specialist: Progress Note  Pre-Mobility:     SpO2 95% RA Post-Mobility:   SpO2 100% RA  Pt agreeable to mobility session - received in  bed. C/o pain at chest tube site rated 8/10. Returned to bed with all needs met - call bell within reach. Girlfriend and MD present.   Virgle Boards, BS Mobility Specialist Please contact via SecureChat or  Rehab office at [phone].

## 2024-04-09 NOTE — Evaluation (Signed)
 Physical Therapy Brief Evaluation and Discharge Note Patient Details Name: Martin Bowman MRN: [id number] DOB: January 13, 1956 Today's Date: 04/09/2024   History of Present Illness  Martin Bowman is a 68 y.o. male with medical history significant of tobacco abuse (quit 2 years ago) presenting after the development of chest pain.      Found to have hydropneumothorax, now s/p chest tube.  Clinical Impression  Pt presents with admitting diagnosis above. Pt today was able to ambulate in hallway and navigate steps independently. PTA pt was fully independent. Pt presents at or near baseline mobility. Pt has no further acute PT needs and will be signing off. Re consult PT if mobility status changes. Pt would benefit from continued mobility with mobility specialist during acute stay.        PT Assessment Patient does not need any further PT services  Assistance Needed at Discharge  PRN    Equipment Recommendations None recommended by PT  Recommendations for Other Services       Precautions/Restrictions Precautions Precautions: Fall;Other (comment) Recall of Precautions/Restrictions: Intact Precaution/Restrictions Comments: Chest tube Restrictions Weight Bearing Restrictions Per Provider Order: No        Mobility  Bed Mobility   Supine/Sidelying to sit: Independent Sit to supine/sidelying: Independent    Transfers Overall transfer level: Independent Equipment used: None                    Ambulation/Gait Ambulation/Gait assistance: Independent Gait Distance (Feet): 250 Feet Assistive device: None Gait Pattern/deviations: WFL(Within Functional Limits) Gait Speed: Pace WFL General Gait Details: no LOB noted  Home Activity Instructions    Stairs Stairs: Yes Stairs assistance: Independent Stair Management: No rails, Alternating pattern, Forwards Number of Stairs: 10 General stair comments: no LOB noted.  Modified Rankin (Stroke Patients Only)        Balance  Overall balance assessment: Independent                        Pertinent Vitals/Pain PT - Brief Vital Signs All Vital Signs Stable: Yes Pain Assessment Pain Assessment: 0-10 Pain Score: 8  Pain Location: Chest tube site Pain Descriptors / Indicators: Sore Pain Intervention(s): Monitored during session, Limited activity within patient's tolerance, Patient requesting pain meds-RN notified     Home Living Family/patient expects to be discharged to:: Private residence Living Arrangements: Spouse/significant other Available Help at Discharge: Family;Available 24 hours/day Home Environment: Stairs to enter  Progress Energy of Steps: 15 Home Equipment: None        Prior Function Level of Independence: Independent      UE/LE Assessment   UE ROM/Strength/Tone/Coordination: WFL    LE ROM/Strength/Tone/Coordination: [facility]      Communication   Communication Communication: No apparent difficulties     Cognition Overall Cognitive Status: Appears within functional limits for tasks assessed/performed       General Comments General comments (skin integrity, edema, etc.): VSS    Exercises     Assessment/Plan    PT Problem List         PT Visit Diagnosis Other abnormalities of gait and mobility (R26.89)    No Skilled PT Patient at baseline level of functioning;Patient is independent with all acitivity/mobility   Co-evaluation                AMPAC 6 Clicks Help needed turning from your back to your side while in a flat bed without using bedrails?: None Help needed moving from lying on your  back to sitting on the side of a flat bed without using bedrails?: None Help needed moving to and from a bed to a chair (including a wheelchair)?: None Help needed standing up from a chair using your arms (e.g., wheelchair or bedside chair)?: None Help needed to walk in hospital room?: None Help needed climbing 3-5 steps with a railing? : None 6 Click Score: 24       End of Session Equipment Utilized During Treatment: Gait belt Activity Tolerance: Patient tolerated treatment well Patient left: in bed;with call bell/phone within reach;with family/visitor present Nurse Communication: Mobility status PT Visit Diagnosis: Other abnormalities of gait and mobility (R26.89)     Time: 8862-8846 PT Time Calculation (min) (ACUTE ONLY): 16 min  Charges:   PT Evaluation $PT Eval Low Complexity: 1 Low      Martin Bowman, PT, DPT Acute Rehab Services [phone]   Martin Bowman  04/09/2024, 12:09 PM

## 2024-04-09 NOTE — Discharge Instructions (Signed)
 Discharge Instructions:  1. You may shower, please wash incisions daily with soap and water and keep dry.  If you wish to cover wounds with dressing you may do so but please keep clean and change daily.  No tub baths or swimming until incisions have completely healed.  If your incisions become red or develop any drainage please call our office at [phone]  2. No Driving until you are no longer using narcotic pain medications  3. Activity- up as tolerated, please walk at least 3 times per day.  Avoid strenuous activity until cleared by Dr. Jeralyn office  4. If any questions or concerns arise, please do not hesitate to contact our office at [phone]

## 2024-04-10 ENCOUNTER — Inpatient Hospital Stay (HOSPITAL_COMMUNITY)

## 2024-04-10 DIAGNOSIS — J90 Pleural effusion, not elsewhere classified: Secondary | ICD-10-CM

## 2024-04-10 DIAGNOSIS — J948 Other specified pleural conditions: Secondary | ICD-10-CM | POA: Diagnosis not present

## 2024-04-10 LAB — BODY FLUID CULTURE W GRAM STAIN: Culture: NO GROWTH

## 2024-04-10 MED ORDER — ACETAMINOPHEN 325 MG PO TABS: 650.0000 mg | ORAL_TABLET | Freq: Four times a day (QID) | ORAL | Status: AC | PRN

## 2024-04-10 NOTE — Progress Notes (Addendum)
      [address]       [place] [address]             [phone]           Subjective: Patient without complaints this am. Wife at bedside  Objective: Vital signs in last 24 hours: Temp:  [97.5 F (36.4 C)-98.7 F (37.1 C)] 98.7 F (37.1 C) (07/26 0332) Pulse Rate:  [84-95] 85 (07/26 0332) Cardiac Rhythm: Normal sinus rhythm (07/25 0800) Resp:  [17-20] 20 (07/26 0332) BP: (117-137)/(61-71) 123/68 (07/26 0332) SpO2:  [91 %-94 %] 91 % (07/26 0332)     Intake/Output from previous day: 07/25 0701 - 07/26 0700 In: 594 [P.O.:594] Out: 705 [Urine:325; Chest Tube:380]   Physical Exam:  Cardiovascular: RRR Pulmonary: Clear to auscultation on left and slightly diminished right base Wounds: Dressing is clean and dry.  Chest Tube: to water seal, no air leak  Lab Results: CBC: Recent Labs    04/08/24 0421 04/09/24 0555  WBC 8.8 9.2  HGB 9.2* 9.1*  HCT 27.6* 27.1*  PLT 259 269   BMET:  Recent Labs    04/08/24 0421 04/09/24 0555  NA 136 136  K 4.0 4.0  CL 103 104  CO2 23 22  GLUCOSE 113* 109*  BUN 17 16  CREATININE 0.84 0.74  CALCIUM 8.5* 8.4*    PT/INR: No results for input(s): LABPROT, INR in the last 72 hours. ABG:  INR: Will add last result for INR, ABG once components are confirmed Will add last 4 CBG results once components are confirmed  Assessment/Plan:  1. CV - SR 2.  Pulmonary - On room air. Chest tube with 380 cc last 24 hours. Chest tube is to water seal, no air leak. CXR this am appears to show trace right apical pneumothorax and small right pleural effusion. As discussed with Dr. Lucas, will remove chest tube. Check CXR. 3. I reviewed CXR with Dr. Lucas and appears stable. Ok to discharge from our standpoint . Follow up appointment has been arranged.   Rowyn Mustapha M ZimmermanPA-C 04/10/2024,7:58 AM

## 2024-04-10 NOTE — Discharge Summary (Addendum)
 Physician Discharge Summary  Martin Bowman Reha FMW:[id number] DOB: 05-10-1956 DOA: 04/07/2024  PCP: Johnny Garnette LABOR, MD  Admit date: 04/07/2024 Discharge date: 04/10/2024  Time spent: 40 minutes  Recommendations for Outpatient Follow-up:  Follow outpatient CBC/CMP  Follow pending cytology Follow with CT surgery as scheduled   Discharge Diagnoses:  Principal Problem:   Hydropneumothorax   Discharge Condition: stable  Diet recommendation: heart healthy   Filed Weights   04/07/24 0731  Weight: 72.6 kg    History of present illness:   Martin Bowman is Martin Bowman 68 y.o. male with medical history significant of tobacco abuse (quit 2 years ago) presenting after the development of chest pain.    Found to have hydropneumothorax, now s/p chest tube.  Discharged 7/26 after chest tube removed with plan for outpatient CT surgery follow up.   Hospital Course:  Assessment and Plan:  Hemopneumothorax Exudative Effusion S/p chest tube placement by the ED 7/23 CXR with R pigtail catheter small apical pneumothorax Appreciate CT surgery evaluation - d/c'd chest tube today, stable for discharge - follow with CT surgery outaptient Thora labs - exudative - cytology (pending follow outpatient) , LDH (elevated), protein (4.8), glucose (63), cell count (3685, 77% neutrophils), culture NGx3 - fluid looked grossly bloody- suspected hemopneumothorax - low concern for infection per pulmonology Pulmonary consult, now signed off   Leukocytosis Resolved, follow  Reported subjective fever/chills, low threshold to start abx   Anemia Trend, follow Labs c/w iron def/aocd - follow    Abnormal EKG Negative troponins, D dimer EF 60-65%, no RWMA (see report)   Hyperglycemia Noted, follow A1c - 5.4   Elevated Lipase No abdominal discomfort Unclear significance   Smoking History Quit in 2022, but significant prior hx      Procedures: Chest tube placement  Echo IMPRESSIONS     1. Left ventricular  ejection fraction, by estimation, is 60 to 65%. The  left ventricle has normal function. The left ventricle has no regional  wall motion abnormalities. Left ventricular diastolic parameters were  normal.   2. Right ventricular systolic function is normal. The right ventricular  size is normal. There is normal pulmonary artery systolic pressure.   3. The mitral valve is normal in structure. No evidence of mitral valve  regurgitation. No evidence of mitral stenosis.   4. The aortic valve is normal in structure. Aortic valve regurgitation is  not visualized. Aortic valve sclerosis is present, with no evidence of  aortic valve stenosis.   5. The inferior vena cava is normal in size with greater than 50%  respiratory variability, suggesting right atrial pressure of 3 mmHg.   Consultations: CT surgery pulmonology  Discharge Exam: Vitals:   04/10/24 0332 04/10/24 0806  BP: 123/68 125/61  Pulse: 85 91  Resp: 20 17  Temp: 98.7 F (37.1 C) 98.7 F (37.1 C)  SpO2: 91% 91%   Eager to discharge when able Wife at bedside  General: No acute distress. Cardiovascular: RRR Lungs: R sided chest tube, unlabored Neurological: Alert and oriented 3. Moves all extremities 4 with equal strength. Cranial nerves II through XII grossly intact. Skin: Warm and dry. No rashes or lesions. Extremities: No clubbing or cyanosis. No edema.  Discharge Instructions   Discharge Instructions     Call MD for:  difficulty breathing, headache or visual disturbances   Complete by: As directed    Call MD for:  extreme fatigue   Complete by: As directed    Call MD for:  hives  Complete by: As directed    Call MD for:  persistant dizziness or light-headedness   Complete by: As directed    Call MD for:  persistant nausea and vomiting   Complete by: As directed    Call MD for:  redness, tenderness, or signs of infection (pain, swelling, redness, odor or green/yellow discharge around incision site)   Complete  by: As directed    Call MD for:  severe uncontrolled pain   Complete by: As directed    Call MD for:  temperature >100.4   Complete by: As directed    Diet - low sodium heart healthy   Complete by: As directed    Discharge instructions   Complete by: As directed    You were seen for Martin Bowman hemopneumothorax (air and blood between the lung and chest wall).  We think this was mechanical.  Related to Martin Bowman torn adhesion in the chest leading to Martin Bowman hemopneumothorax.  You were treated with Luca Burston chest tube for drainage.  You were seen by pulmonary and CT surgery.  Your chest tube was removed today and you're currently stable for discharge.  You have Martin Bowman pending result from the pleural fluid.  There is pending cytology that you should follow up with your outpatient doctor.    Return for new, recurrent, or worsening symptoms.  Please ask your PCP to request records from this hospitalization so they know what was done and what the next steps will be.   Increase activity slowly   Complete by: As directed       Allergies as of 04/10/2024   No Known Allergies      Medication List     STOP taking these medications    diazepam  5 MG tablet Commonly known as: VALIUM    ibuprofen 200 MG tablet Commonly known as: ADVIL       TAKE these medications    acetaminophen  325 MG tablet Commonly known as: TYLENOL  Take 2 tablets (650 mg total) by mouth every 6 (six) hours as needed for mild pain (pain score 1-3) or headache.   Glucosamine 500 MG Caps Take 1,500 mg by mouth daily at 12 noon.   multivitamin tablet Take 1 tablet by mouth daily.       No Known Allergies  Follow-up Information     [name], PA-C Follow up on 04/19/2024.   Specialties: Physician Assistant, Thoracic Surgery Why: Follow up appointment is at 9:30AM, please get chest xray at 10:30AM prior to your appointment on the 2nd floor of our building Contact information: [address] [place]  [address] [phone]                  The results of significant diagnostics from this hospitalization (including imaging, microbiology, ancillary and laboratory) are listed below for reference.    Significant Diagnostic Studies: DG Chest Port 1 View Result Date: 04/10/2024 CLINICAL DATA:  [id number] Pneumothorax, right [phone] EXAM: PORTABLE CHEST - 1 VIEW COMPARISON:  the previous day's study FINDINGS: Right pigtail catheter is partially formed in the mid chest. Evart Mcdonnell small apical pneumothorax seen previously is less conspicuous. Infrahilar consolidation and ill-defined hazy opacities in the right lower lung stable. Linear scarring or atelectasis at the left lung base stable. Heart size and mediastinal contours are within normal limits. Aortic Atherosclerosis (ICD10-170.0). No definite effusion. Visualized bones unremarkable. IMPRESSION: 1. Right pigtail catheter with less conspicuous small apical pneumothorax. 2. Stable right infrahilar consolidation and hazy opacities. Electronically Signed  By: JONETTA Faes M.D.   On: 04/10/2024 08:37   DG Chest Port 1 View Result Date: 04/09/2024 CLINICAL DATA:  [id number] Pneumothorax, right [phone] EXAM: PORTABLE CHEST - 1 VIEW COMPARISON:  04/08/2024 FINDINGS: Stable right chest tube directed medially toward the apex. Small apical pneumothorax less than 10%. Layering hazy opacity of the right hemithorax suggesting layering small effusion. Mild patchy opacities in the lung bases stable. Heart size and mediastinal contours are within normal limits. Aortic Atherosclerosis (ICD10-170.0). Visualized bones unremarkable. IMPRESSION: 1. Small right apical pneumothorax with chest tube in place. 2. Layering small right effusion. Electronically Signed   By: JONETTA Faes M.D.   On: 04/09/2024 10:11   ECHOCARDIOGRAM COMPLETE Result Date: 04/08/2024    ECHOCARDIOGRAM REPORT   Patient Name:   RASHIDI LOH Date of Exam: 04/08/2024 Medical Rec #:  [id number]      Height:        68.5 in Accession #:    [id number]     Weight:       160.0 lb Date of Birth:  1956/05/25      BSA:          1.869 m Patient Age:    68 years       BP:           134/63 mmHg Patient Gender: M              HR:           83 bpm. Exam Location:  Inpatient Procedure: 2D Echo, Color Doppler and Cardiac Doppler (Both Spectral and Color            Flow Doppler were utilized during procedure). Indications:    Abnormal ECG  History:        Patient has no prior history of Echocardiogram examinations.  Sonographer:    Benard Stallion Referring Phys: [phone] Murrel Freet CALDWELL POWELL JR IMPRESSIONS  1. Left ventricular ejection fraction, by estimation, is 60 to 65%. The left ventricle has normal function. The left ventricle has no regional wall motion abnormalities. Left ventricular diastolic parameters were normal.  2. Right ventricular systolic function is normal. The right ventricular size is normal. There is normal pulmonary artery systolic pressure.  3. The mitral valve is normal in structure. No evidence of mitral valve regurgitation. No evidence of mitral stenosis.  4. The aortic valve is normal in structure. Aortic valve regurgitation is not visualized. Aortic valve sclerosis is present, with no evidence of aortic valve stenosis.  5. The inferior vena cava is normal in size with greater than 50% respiratory variability, suggesting right atrial pressure of 3 mmHg. FINDINGS  Left Ventricle: Left ventricular ejection fraction, by estimation, is 60 to 65%. The left ventricle has normal function. The left ventricle has no regional wall motion abnormalities. The left ventricular internal cavity size was normal in size. There is  no left ventricular hypertrophy. Left ventricular diastolic parameters were normal. Right Ventricle: The right ventricular size is normal. No increase in right ventricular wall thickness. Right ventricular systolic function is normal. There is normal pulmonary artery systolic pressure. The tricuspid  regurgitant velocity is 2.47 m/s, and  with an assumed right atrial pressure of 3 mmHg, the estimated right ventricular systolic pressure is 27.4 mmHg. Left Atrium: Left atrial size was normal in size. Right Atrium: Right atrial size was normal in size. Pericardium: There is no evidence of pericardial effusion. Mitral Valve: The mitral valve is normal in structure. No evidence of mitral valve  regurgitation. No evidence of mitral valve stenosis. Tricuspid Valve: The tricuspid valve is normal in structure. Tricuspid valve regurgitation is mild . No evidence of tricuspid stenosis. Aortic Valve: The aortic valve is normal in structure. Aortic valve regurgitation is not visualized. Aortic valve sclerosis is present, with no evidence of aortic valve stenosis. Aortic valve mean gradient measures 4.5 mmHg. Aortic valve peak gradient measures 9.2 mmHg. Aortic valve area, by VTI measures 3.15 cm. Pulmonic Valve: The pulmonic valve was normal in structure. Pulmonic valve regurgitation is not visualized. No evidence of pulmonic stenosis. Aorta: The aortic root is normal in size and structure. Venous: The inferior vena cava is normal in size with greater than 50% respiratory variability, suggesting right atrial pressure of 3 mmHg. IAS/Shunts: No atrial level shunt detected by color flow Doppler.  LEFT VENTRICLE PLAX 2D LVIDd:         4.50 cm   Diastology LVIDs:         2.40 cm   LV e' medial:    6.31 cm/s LV PW:         1.00 cm   LV E/e' medial:  10.4 LV IVS:        1.00 cm   LV e' lateral:   10.20 cm/s LVOT diam:     2.30 cm   LV E/e' lateral: 6.4 LV SV:         84 LV SV Index:   45 LVOT Area:     4.15 cm  RIGHT VENTRICLE RV Basal diam:  3.50 cm RV Mid diam:    3.70 cm RV S prime:     16.60 cm/s TAPSE (M-mode): 2.2 cm LEFT ATRIUM             Index        RIGHT ATRIUM           Index LA diam:        4.00 cm 2.14 cm/m   RA Area:     18.50 cm LA Vol (A2C):   55.5 ml 29.70 ml/m  RA Volume:   48.20 ml  25.79 ml/m LA Vol (A4C):    44.8 ml 23.97 ml/m LA Biplane Vol: 50.2 ml 26.86 ml/m  AORTIC VALVE AV Area (Vmax):    2.93 cm AV Area (Vmean):   3.01 cm AV Area (VTI):     3.15 cm AV Vmax:           151.50 cm/s AV Vmean:          101.850 cm/s AV VTI:            0.266 m AV Peak Grad:      9.2 mmHg AV Mean Grad:      4.5 mmHg LVOT Vmax:         107.00 cm/s LVOT Vmean:        73.700 cm/s LVOT VTI:          0.201 m LVOT/AV VTI ratio: 0.76  AORTA Ao Root diam: 4.00 cm Ao Asc diam:  3.70 cm MITRAL VALVE               TRICUSPID VALVE MV Area (PHT): 3.91 cm    TR Peak grad:   24.4 mmHg MV Decel Time: 194 msec    TR Vmax:        247.00 cm/s MV E velocity: 65.60 cm/s MV Zelma Snead velocity: 73.10 cm/s  SHUNTS MV E/Sumaiyah Markert ratio:  0.90        Systemic VTI:  0.20 m                            Systemic Diam: 2.30 cm Morene Brownie Electronically signed by Morene Brownie Signature Date/Time: 04/08/2024/2:55:22 PM    Final    DG CHEST PORT 1 VIEW Result Date: 04/08/2024 CLINICAL DATA:  Right hydropneumothorax, status post chest tube placement. EXAM: PORTABLE CHEST 1 VIEW COMPARISON:  04/07/2024 FINDINGS: Right pleural drainage catheter remains in place. Improved aeration at the right lung base although with some residual hazy opacity in the mid lung and right lung base likely representing combination of residual effusion and atelectasis. Continued obscuration of the right hemidiaphragm. Bandlike density favoring mild atelectasis along the left hemidiaphragm. Atherosclerotic calcification of the aortic arch. Tortuous thoracic aorta. IMPRESSION: 1. Improved aeration at the right lung base although with some residual hazy opacity in the mid lung and right lung base likely representing combination of residual effusion and atelectasis. 2. Right pleural drainage catheter remains in place. 3. Bandlike density favoring mild atelectasis along the left hemidiaphragm. 4. Aortic Atherosclerosis (ICD10-I70.0). Electronically Signed   By: Ryan Salvage M.D.   On: 04/08/2024  10:18   DG Chest Port 1 View Result Date: 04/07/2024 CLINICAL DATA:  Status post chest tube placement. EXAM: PORTABLE CHEST 1 VIEW COMPARISON:  04/07/2024 FINDINGS: Interval placement of right-sided chest tube with evacuation of the right pleural gas. No residual pneumothorax evident. Right base collapse/consolidation with effusion again noted. Left lung clear. Heart size stable. Telemetry leads overlie the chest. IMPRESSION: 1. Interval placement of right-sided chest tube with evacuation of the right pleural gas. No residual pneumothorax evident. 2. Persistent right base collapse/consolidation with effusion. Electronically Signed   By: Camellia Candle M.D.   On: 04/07/2024 09:20   DG Chest 2 View Result Date: 04/07/2024 CLINICAL DATA:  Chest pain and coughing.  Symptom onset 2 days ago. EXAM: CHEST - 2 VIEW COMPARISON:  Chest PA Lat 03/30/2015 FINDINGS: There is Naimah Yingst right hydropneumothorax. The hydro component small to moderate in size extending just over the top of the hemidiaphragm. The pneumo component appears to surround the right upper lobe. The middle and lower lobes are not well seen on PA due to the pleural fluid. Judging from the lateral view, the right middle lobe appears at least partially collapsed but the right lower lobe may remain aerated except where compressed by the effusion. Volume of pneumothorax is estimated at least 40% and possibly as much as 50%, without Kordelia Severin notable mediastinal shift. There is right perihilar linear atelectasis. The lungs are emphysematous and otherwise clear. Heart size and vasculature are normal except for calcification in the transverse aorta. There is Tovia Kisner stable mediastinal and mild aortic tortuosity. No new osseous abnormality. Thoracic spondylosis. IMPRESSION: 1. Right hydropneumothorax, with the hydro component small to moderate in size and the pneumo component surrounding the right upper lobe. 2. The right middle lobe appears at least partially collapsed but the right  lower lobe may remain aerated except where compressed by the effusion. 3. Volume of pneumothorax is estimated at least 40% and possibly as much as 50%, without Keelan Tripodi notable mediastinal shift. 4. Emphysema. 5. Aortic atherosclerosis. 6. Right perihilar linear atelectasis. 7. Our PRA is attempting to reach the ordering physician for stat notification at time of signing. Electronically Signed   By: Francis Quam M.D.   On: 04/07/2024 07:41    Microbiology: Recent Results (from the past 240 hours)  Pleural fluid culture  w Gram Stain     Status: None   Collection Time: 04/07/24  4:49 PM   Specimen: Pleural Fluid  Result Value Ref Range Status   Specimen Description PLEURAL  Final   Special Requests RIGHT  Final   Gram Stain   Final    RARE WBC PRESENT,BOTH PMN AND MONONUCLEAR NO ORGANISMS SEEN    Culture   Final    NO GROWTH 3 DAYS Performed at [facility], [address], [place], [place] [address]    Report Status 04/10/2024 FINAL  Final     Labs: Basic Metabolic Panel: Recent Labs  Lab 04/07/24 0710 04/08/24 0421 04/09/24 0555  NA 138 136 136  K 3.8 4.0 4.0  CL 102 103 104  CO2 22 23 22   GLUCOSE 155* 113* 109*  BUN 17 17 16   CREATININE 0.81 0.84 0.74  CALCIUM 9.3 8.5* 8.4*  MG  --  1.8 1.9  PHOS  --  3.3 3.4   Liver Function Tests: Recent Labs  Lab 04/07/24 0710 04/08/24 0421  AST 19 17  ALT 16 17  ALKPHOS 63 41  BILITOT 0.6 0.6  PROT 7.7 6.4*  ALBUMIN 4.1 2.9*   Recent Labs  Lab 04/07/24 0710  LIPASE 72*   No results for input(s): AMMONIA in the last 168 hours. CBC: Recent Labs  Lab 04/07/24 0710 04/08/24 0421 04/09/24 0555  WBC 12.2* 8.8 9.2  NEUTROABS  --  6.2  --   HGB 10.7* 9.2* 9.1*  HCT 31.8* 27.6* 27.1*  MCV 85.5 85.4 85.2  PLT 298 259 269   Cardiac Enzymes: No results for input(s): CKTOTAL, CKMB, CKMBINDEX, TROPONINI in the last 168 hours. BNP: BNP (last 3 results) No results for input(s): BNP in the last 8760  hours.  ProBNP (last 3 results) No results for input(s): PROBNP in the last 8760 hours.  CBG: No results for input(s): GLUCAP in the last 168 hours.     Signed:  Meliton Monte MD.  [facility] 04/10/2024, 11:39 AM

## 2024-04-10 NOTE — Plan of Care (Signed)

## 2024-04-12 ENCOUNTER — Telehealth: Payer: Self-pay | Admitting: *Deleted

## 2024-04-12 LAB — CYTOLOGY - NON PAP

## 2024-04-12 NOTE — Telephone Encounter (Signed)
 Contacted patient's wife to follow up on call placed to the answering service over the weekend. Per wife, patient had a temp of 100.4 on Saturday. States she spoke with Dr. Lucas. States temp has gone done since Saturday. Wife concerned about patient's O2 levels. [place] oximeter is showing O2 levels ranging from 88-91. Advised wife if saturations stay consistently below 90 to seek care in ED for evaluation. Advised for patient to continue to use IS and take frequent walks. Advised wife to encourage deep breathing exercises. Reviewed upcoming appt with wife. Advised to call if needed.

## 2024-04-15 ENCOUNTER — Encounter: Admitting: Family Medicine

## 2024-04-16 ENCOUNTER — Other Ambulatory Visit: Payer: Self-pay | Admitting: Surgery

## 2024-04-16 DIAGNOSIS — J948 Other specified pleural conditions: Secondary | ICD-10-CM

## 2024-04-19 ENCOUNTER — Ambulatory Visit

## 2024-04-19 ENCOUNTER — Ambulatory Visit (HOSPITAL_COMMUNITY)
Admission: RE | Admit: 2024-04-19 | Discharge: 2024-04-19 | Disposition: A | Discharge status: Home / Self Care | Source: Ambulatory Visit | Attending: Cardiology | Admitting: Cardiology

## 2024-04-19 VITALS — BP 155/58 | HR 90 | Resp 20 | Ht 68.5 in | Wt 155.0 lb

## 2024-04-19 DIAGNOSIS — J948 Other specified pleural conditions: Secondary | ICD-10-CM

## 2024-04-19 DIAGNOSIS — R918 Other nonspecific abnormal finding of lung field: Secondary | ICD-10-CM | POA: Diagnosis not present

## 2024-04-19 DIAGNOSIS — J90 Pleural effusion, not elsewhere classified: Secondary | ICD-10-CM | POA: Diagnosis not present

## 2024-04-19 DIAGNOSIS — J9383 Other pneumothorax: Secondary | ICD-10-CM | POA: Insufficient documentation

## 2024-04-19 DIAGNOSIS — J939 Pneumothorax, unspecified: Secondary | ICD-10-CM | POA: Diagnosis not present

## 2024-04-19 MED ORDER — METHYLPREDNISOLONE 4 MG PO TBPK: ORAL_TABLET | ORAL | 0 refills | Status: DC

## 2024-04-19 NOTE — Patient Instructions (Signed)
-  Take steroids as prescribed -Follow up in 2 week with clinic with chest xray for pleural effusion

## 2024-04-19 NOTE — Progress Notes (Signed)
 [address] [place] [address]             [phone]       HPI: Mr. Martin Bowman is a 68 year old male with no significant medical history and is a former tobacco user who presents to the clinic for follow up of hydropneumothorax.  He presented to the ER with right-sided chest pain after attempting to lift a 100 pound tool. CXR on 07/23 showed a right hydropneumothorax with the hydro component small to moderate in size and the pneumothorax component is surrounding the right upper lobe, the right middle lobe appears partially collapsed and the right lower lobe remained aerated except in the area compressed by the effusion, the volume of pneumothorax is estimated at least 40-50% without mediastinal shift. A chest tube was placed and he was admitted.  He monitored and surgery was not required.  After 4 days his chest tube was able to be removed and chest xray showed a 2% right apical pneumothorax with mild blunting of the costophrenic angles bilaterally with density along the hemidiaphragms favoring atelectasis. He was discharged on 04/10/2024.  Since hospital discharge the patient reports that he has been feeling fatigued and has not had much of an appetite.  He has been using his incentive spirometer and has been attempting to walk around his house.  He is inquiring about when he can return to work.  He denies chest pain, shortness of breath and chest tightness.    Current Outpatient Medications  Medication Sig Dispense Refill   acetaminophen  (TYLENOL ) 325 MG tablet Take 2 tablets (650 mg total) by mouth every 6 (six) hours as needed for mild pain (pain score 1-3) or headache.     Glucosamine 500 MG CAPS Take 1,500 mg by mouth daily at 12 noon.     Multiple Vitamin (MULTIVITAMIN) tablet Take 1 tablet by mouth daily.     No current facility-administered medications for this visit.   Vitals:   04/19/24 0912  BP: (!) 155/58  Pulse: 90  Resp: 20  SpO2: 97%    Review of Systems  Constitutional:  Positive for malaise/fatigue. Negative for fever.  Respiratory: Negative.  Negative for cough, shortness of breath and wheezing.   Cardiovascular: Negative.  Negative for chest pain and leg swelling.  Neurological: Negative.  Negative for dizziness.   Physical Exam Constitutional:      Appearance: Normal appearance.  HENT:     Head: Normocephalic and atraumatic.  Cardiovascular:     Rate and Rhythm: Normal rate and regular rhythm.     Heart sounds: Normal heart sounds, S1 normal and S2 normal.  Pulmonary:     Effort: Pulmonary effort is normal.     Breath sounds: Examination of the right-lower field reveals decreased breath sounds. Decreased breath sounds present. No wheezing.     Comments: Slightly decreased  Skin:    General: Skin is warm and dry.  Neurological:     General: No focal deficit present.     Mental Status: He is alert and oriented to person, place, and time.       Diagnostic Tests: CLINICAL DATA:  pneumothorax   EXAM: CHEST - 2 VIEW   COMPARISON:  04/10/2024.   FINDINGS: Redemonstration of sliver right apical pneumothorax, essentially similar to the prior study. On the left, there is apparent sharply marginated line at the apex, which was not seen on the immediate prior study but can be  seen on the studies before that and likely corresponds to partially calcified left apical pleural cap on the prior CT scan from 04/30/2023, favoring artifactual.   There is also new homogeneous opacity overlying the right lower lung zone, centrally, which causes loss of spine sign on the lateral film and favored to represent atelectatic changes in the right lung lower lobe. There is associated small right pleural effusion, likely slightly increased since the prior study. Bilateral lungs are otherwise clear. Bilateral lungs appear hyperexpanded and hyperlucent with coarse bronchovascular markings, in keeping with COPD.   No left  pleural effusion.   Normal cardio-mediastinal silhouette.   No acute osseous abnormalities.   The soft tissues are within normal limits.   IMPRESSION: 1. Redemonstration of sliver right apical pneumothorax, essentially similar to the prior study. 2. There is new homogeneous opacity overlying the right lower lung zone, centrally, which causes loss of spine sign on the lateral film and favored to represent atelectatic changes in the right lung lower lobe. There is associated small right pleural effusion, likely slightly increased since the prior study. 3. Other observations, as described above.     Electronically Signed   By: Ree Molt M.D.   On: 04/19/2024 09:12  Plan: Hydropneumothorax - Continue to use incentive spirometer and increase activity as tolerated. Attempt to eat small meals throughout the day and continue to drink protein drinks to help with appetite. He may return to work as tolerated but should refrain from heavy lifting.  He can gradually increase lifting over the next couple weeks.  Alarm symptoms discussed and patient understands to return to the emergency room with chest pain, shortness of breath, dizziness, chest tightness, fever.  Pleural effusion - Small right-sided pleural effusion that is increased in size since discharge.  Prescribed steroid taper to see if this will help resolve pleural effusion.  Side effects of steroids discussed with patient states understanding.  Follow-up appointment made in 2 weeks with chest x-ray to see if pleural effusion has resolved/improved.       Manuelita CHRISTELLA Rough, PA-C [facility] [phone]

## 2024-04-20 ENCOUNTER — Encounter: Payer: Self-pay | Admitting: Family Medicine

## 2024-04-20 ENCOUNTER — Ambulatory Visit (INDEPENDENT_AMBULATORY_CARE_PROVIDER_SITE_OTHER): Admitting: Family Medicine

## 2024-04-20 VITALS — BP 128/64 | HR 74 | Temp 98.0°F | Ht 68.75 in | Wt 153.8 lb

## 2024-04-20 DIAGNOSIS — Z0000 Encounter for general adult medical examination without abnormal findings: Secondary | ICD-10-CM | POA: Diagnosis not present

## 2024-04-20 LAB — LIPID PANEL
Cholesterol: 158 mg/dL (ref 0–200)
HDL: 39.8 mg/dL (ref >39.00)
LDL Cholesterol: 108 mg/dL — ABNORMAL HIGH (ref 0–99)
NonHDL: 117.97
Total CHOL/HDL Ratio: 4
Triglycerides: 52 mg/dL (ref 0.0–149.0)
VLDL: 10.4 mg/dL (ref 0.0–40.0)

## 2024-04-20 LAB — CBC WITH DIFFERENTIAL/PLATELET
Basophils Absolute: 0 K/uL (ref 0.0–0.1)
Basophils Relative: 0.3 % (ref 0.0–3.0)
Eosinophils Absolute: 0.1 K/uL (ref 0.0–0.7)
Eosinophils Relative: 0.7 % (ref 0.0–5.0)
HCT: 27.2 % — ABNORMAL LOW (ref 39.0–52.0)
Hemoglobin: 8.8 g/dL — ABNORMAL LOW (ref 13.0–17.0)
Lymphocytes Relative: 14.9 % (ref 12.0–46.0)
Lymphs Abs: 2.1 K/uL (ref 0.7–4.0)
MCHC: 32.3 g/dL (ref 30.0–36.0)
MCV: 83.3 fl (ref 78.0–100.0)
Monocytes Absolute: 1.1 K/uL — ABNORMAL HIGH (ref 0.1–1.0)
Monocytes Relative: 7.7 % (ref 3.0–12.0)
Neutro Abs: 10.8 K/uL — ABNORMAL HIGH (ref 1.4–7.7)
Neutrophils Relative %: 76.4 % (ref 43.0–77.0)
Platelets: 934 K/uL — ABNORMAL HIGH (ref 150.0–400.0)
RBC: 3.26 Mil/uL — ABNORMAL LOW (ref 4.22–5.81)
RDW: 13.7 % (ref 11.5–15.5)
WBC: 14.1 K/uL — ABNORMAL HIGH (ref 4.0–10.5)

## 2024-04-20 LAB — PSA: PSA: 1.32 ng/mL (ref 0.10–4.00)

## 2024-04-20 LAB — HEPATIC FUNCTION PANEL
ALT: 54 U/L — ABNORMAL HIGH (ref 0–53)
AST: 31 U/L (ref 0–37)
Albumin: 3.5 g/dL (ref 3.5–5.2)
Alkaline Phosphatase: 126 U/L — ABNORMAL HIGH (ref 39–117)
Bilirubin, Direct: 0.1 mg/dL (ref 0.0–0.3)
Total Bilirubin: 0.3 mg/dL (ref 0.2–1.2)
Total Protein: 7.3 g/dL (ref 6.0–8.3)

## 2024-04-20 LAB — TSH: TSH: 1.06 u[IU]/mL (ref 0.35–5.50)

## 2024-04-20 NOTE — Progress Notes (Signed)
 Subjective:    Patient ID: Martin Bowman, male    DOB: 1956-07-20, 68 y.o.   MRN: [id number]  HPI Here for a well exam. He was in  the hospital from 04-07-24 to 04-10-24 for a spontaneous right hydropneumothorax at the right apex. He presented with chest pain after lifting a 100 lb piece of equipment at work. In the hospital a chest tube was laced and then removed. His CXR at DC showed a sliver of pneumothorax remaining at the right apex and there was a slowly enlarging right pleural effusion at the base. Cytology of the fluid was benign and noninfectious. Labs included an initial WBC 12.2 which went down to 9.2, a Hgb of 10.7 which went down to 9.1, normal creatinine at 0.74, and A1c of 5.4%. an ECHO was normal with an EF of 60-65%. He had a follow up visit with Cardiothoracic Surgery yesterday, and they noted the right pleural effusion. They started him on a Medrol  dose pack, and they will see him in 2 weeks. They cleared him to return to work with a restriction of no lifting greater than 10 lbs. He is using an incentive spirometer. He denies any chest pain or SOB.    Review of Systems  Constitutional: Negative.   HENT: Negative.    Eyes: Negative.   Respiratory: Negative.    Cardiovascular: Negative.   Gastrointestinal: Negative.   Genitourinary: Negative.   Musculoskeletal: Negative.   Skin: Negative.   Neurological: Negative.   Psychiatric/Behavioral: Negative.         Objective:   Physical Exam Constitutional:      General: He is not in acute distress.    Appearance: Normal appearance. He is well-developed. He is not diaphoretic.  HENT:     Head: Normocephalic and atraumatic.     Right Ear: External ear normal.     Left Ear: External ear normal.     Nose: Nose normal.     Mouth/Throat:     Pharynx: No oropharyngeal exudate.  Eyes:     General: No scleral icterus.       Right eye: No discharge.        Left eye: No discharge.     Conjunctiva/sclera: Conjunctivae normal.      Pupils: Pupils are equal, round, and reactive to light.  Neck:     Thyroid : No thyromegaly.     Vascular: No JVD.     Trachea: No tracheal deviation.  Cardiovascular:     Rate and Rhythm: Normal rate and regular rhythm.     Pulses: Normal pulses.     Heart sounds: Normal heart sounds. No murmur heard.    No friction rub. No gallop.  Pulmonary:     Effort: Pulmonary effort is normal. No respiratory distress.     Breath sounds: No wheezing, rhonchi or rales.     Comments: Breath sounds are absent at the right apex  Chest:     Chest wall: No tenderness.  Abdominal:     General: Bowel sounds are normal. There is no distension.     Palpations: Abdomen is soft. There is no mass.     Tenderness: There is no abdominal tenderness. There is no guarding or rebound.  Genitourinary:    Penis: Normal. No tenderness.      Testes: Normal.     Prostate: Normal.     Rectum: Normal. Guaiac result negative.  Musculoskeletal:        General: No tenderness. Normal range of  motion.     Cervical back: Neck supple.  Lymphadenopathy:     Cervical: No cervical adenopathy.  Skin:    General: Skin is warm and dry.     Coloration: Skin is not pale.     Findings: No erythema or Rye.  Neurological:     General: No focal deficit present.     Mental Status: He is alert and oriented to person, place, and time.     Cranial Nerves: No cranial nerve deficit.     Motor: No abnormal muscle tone.     Coordination: Coordination normal.     Deep Tendon Reflexes: Reflexes are normal and symmetric. Reflexes normal.  Psychiatric:        Mood and Affect: Mood normal.        Behavior: Behavior normal.        Thought Content: Thought content normal.        Judgment: Judgment normal.           Assessment & Plan:  Well exam. We discussed diet and exercise. Get fasting labs. He is recovering as expected from a pneumothorax. He will follow up with Cardiothoracic Surgery in 2 weeks. SABRA  Garnette Olmsted, MD

## 2024-04-22 ENCOUNTER — Ambulatory Visit: Payer: Self-pay | Admitting: Family Medicine

## 2024-04-22 DIAGNOSIS — D649 Anemia, unspecified: Secondary | ICD-10-CM

## 2024-04-30 ENCOUNTER — Other Ambulatory Visit: Payer: Self-pay | Admitting: Surgery

## 2024-04-30 DIAGNOSIS — J948 Other specified pleural conditions: Secondary | ICD-10-CM

## 2024-05-03 ENCOUNTER — Ambulatory Visit (HOSPITAL_COMMUNITY)
Admission: RE | Admit: 2024-05-03 | Discharge: 2024-05-03 | Disposition: A | Discharge status: Home / Self Care | Source: Ambulatory Visit | Attending: Cardiovascular Disease | Admitting: Cardiovascular Disease

## 2024-05-03 ENCOUNTER — Other Ambulatory Visit: Payer: Self-pay | Admitting: Surgery

## 2024-05-03 ENCOUNTER — Ambulatory Visit

## 2024-05-03 VITALS — BP 137/67 | HR 79 | Resp 20 | Ht 68.5 in | Wt 164.0 lb

## 2024-05-03 DIAGNOSIS — J948 Other specified pleural conditions: Secondary | ICD-10-CM

## 2024-05-03 DIAGNOSIS — J90 Pleural effusion, not elsewhere classified: Secondary | ICD-10-CM | POA: Insufficient documentation

## 2024-05-03 DIAGNOSIS — R918 Other nonspecific abnormal finding of lung field: Secondary | ICD-10-CM | POA: Diagnosis not present

## 2024-05-03 NOTE — Progress Notes (Signed)
 [address] [place] [address]             [phone]       HPI: Mr. Martin Bowman is a 68 year old male with no significant medical history and is a former tobacco user who presents to the clinic for follow up of hydropneumothorax.  He presented to the ER with right-sided chest pain after attempting to lift a 100 pound tool. CXR on 07/23 showed a right hydropneumothorax with the hydro component small to moderate in size and the pneumothorax component is surrounding the right upper lobe, the right middle lobe appears partially collapsed and the right lower lobe remained aerated except in the area compressed by the effusion, the volume of pneumothorax is estimated at least 40-50% without mediastinal shift. A chest tube was placed and he was admitted.  He monitored and surgery was not required.  After 4 days his chest tube was able to be removed and chest xray showed a 2% right apical pneumothorax with mild blunting of the costophrenic angles bilaterally with density along the hemidiaphragms favoring atelectasis. He was discharged on 04/10/2024.  He was seen on 04/19/2024 in clinic and was found to have a small right-sided pleural effusion.  He was prescribed steroid taper at that time.   Since last clinic visit patient reports that he has been going back to work.  He still gets tired quickly but this is has slowly been improving.  His appetite returned quickly once starting the steroids.  He denies shortness of breath and chest pain.   Current Outpatient Medications  Medication Sig Dispense Refill   acetaminophen  (TYLENOL ) 325 MG tablet Take 2 tablets (650 mg total) by mouth every 6 (six) hours as needed for mild pain (pain score 1-3) or headache.     Glucosamine 500 MG CAPS Take 1,500 mg by mouth daily at 12 noon.     methylPREDNISolone  (MEDROL  DOSEPAK) 4 MG TBPK tablet Take 6 tablets on day one, take 5 tablets on day two, take 4 tablets on day three, take 3 tablets  on day four, take 2 tablets on day five, take one tablet on day six 21 each 0   Multiple Vitamin (MULTIVITAMIN) tablet Take 1 tablet by mouth daily.     No current facility-administered medications for this visit.   Vitals:   05/03/24 1525  BP: 137/67  Pulse: 79  Resp: 20  SpO2: 97%    Review of Systems  Constitutional:  Positive for malaise/fatigue. Negative for fever.       After working  Respiratory: Negative.  Negative for cough, shortness of breath and wheezing.   Cardiovascular: Negative.  Negative for chest pain and leg swelling.  Neurological: Negative.  Negative for dizziness.  All other systems reviewed and are negative.  Physical Exam Constitutional:      Appearance: Normal appearance.  HENT:     Head: Normocephalic and atraumatic.  Cardiovascular:     Rate and Rhythm: Normal rate and regular rhythm.     Heart sounds: Normal heart sounds, S1 normal and S2 normal.  Pulmonary:     Effort: Pulmonary effort is normal.     Breath sounds: Normal breath sounds.  Skin:    General: Skin is warm and dry.  Neurological:     General: No focal deficit present.     Mental Status: He is alert and oriented to person, place, and time.  Diagnostic Tests: CLINICAL DATA:  Hydropneumothorax   EXAM: DG CHEST 2V   COMPARISON:  04/19/2024, 04/30/2023   FINDINGS: Frontal and lateral views of the chest demonstrate a stable cardiac silhouette. The trace right apical pneumothorax seen on prior study is no longer evident. Persistent oval area of consolidation within the right lower lobe again noted and unchanged. Stable small right pleural effusion. Left chest is clear. No acute bony abnormalities.   IMPRESSION: 1. Persistent oval region of consolidation localizing to the right lower lobe. While this could reflect right lower lobe atelectasis given history of prior right hydropneumothorax, underlying right lung mass cannot be excluded. Follow-up chest CT is recommended  for further characterization. 2. Small right pleural effusion, unchanged. 3. Resolution of the right apical pneumothorax seen previously.   These results will be called to the ordering clinician or representative by the Radiologist Assistant, and communication documented in the PACS or [employer].     Electronically Signed   By: Ozell Daring M.D.   On: 05/03/2024 15:48  Plan:  Hydropneumothorax -Resolution at this time -Continue to use incentive spirometer. He can continue to work as tolerated and he can slowly increase his lifting. He is to reach out if his employer needs a work note.   -Alarm symptoms discussed and patient understands to return to the emergency room with chest pain, shortness of breath, dizziness, chest tightness, fever.  Pleural effusion - Still small right-sided pleural effusion  Persistent oval region of consolidation localizing to the right lower lobe -On chest xray from 05/03/2024 -Possibility the area is atelectasis  -CT scan of chest ordered for follow up of area per radiology recommendation -Follow up apt made in one month so results of CT scan can be discussed  Manuelita CHRISTELLA Rough, PA-C [facility] [phone]

## 2024-05-03 NOTE — Patient Instructions (Signed)
-  CT of chest for right sided oval opacity in lung -Follow up in one month with our clinic for CT scan results -Continue with follow up with PCP

## 2024-05-10 ENCOUNTER — Encounter: Payer: Self-pay | Admitting: *Deleted

## 2024-05-21 ENCOUNTER — Other Ambulatory Visit: Payer: Self-pay | Admitting: Emergency Medicine

## 2024-05-21 ENCOUNTER — Other Ambulatory Visit

## 2024-05-21 ENCOUNTER — Other Ambulatory Visit: Payer: Self-pay

## 2024-05-21 DIAGNOSIS — D649 Anemia, unspecified: Secondary | ICD-10-CM

## 2024-05-21 LAB — CBC WITH DIFFERENTIAL/PLATELET
Absolute Lymphocytes: 1928 {cells}/uL (ref 850–3900)
Absolute Monocytes: 781 {cells}/uL (ref 200–950)
Basophils Absolute: 31 {cells}/uL (ref 0–200)
Basophils Relative: 0.5 %
Eosinophils Absolute: 217 {cells}/uL (ref 15–500)
Eosinophils Relative: 3.5 %
HCT: 32.6 % — ABNORMAL LOW (ref 38.5–50.0)
Hemoglobin: 10 g/dL — ABNORMAL LOW (ref 13.2–17.1)
MCH: 26.1 pg — ABNORMAL LOW (ref 27.0–33.0)
MCHC: 30.7 g/dL — ABNORMAL LOW (ref 32.0–36.0)
MCV: 85.1 fL (ref 80.0–100.0)
MPV: 9.8 fL (ref 7.5–12.5)
Monocytes Relative: 12.6 %
Neutro Abs: 3243 {cells}/uL (ref 1500–7800)
Neutrophils Relative %: 52.3 %
Platelets: 454 Thousand/uL — ABNORMAL HIGH (ref 140–400)
RBC: 3.83 Million/uL — ABNORMAL LOW (ref 4.20–5.80)
RDW: 13.8 % (ref 11.0–15.0)
Total Lymphocyte: 31.1 %
WBC: 6.2 Thousand/uL (ref 3.8–10.8)

## 2024-05-21 NOTE — Addendum Note (Signed)
 Addended by: JENEL SMALLER D on: 05/21/2024 03:55 PM   Modules accepted: Orders

## 2024-05-24 ENCOUNTER — Ambulatory Visit: Payer: Self-pay | Admitting: Family Medicine

## 2024-05-26 ENCOUNTER — Other Ambulatory Visit: Payer: Self-pay

## 2024-05-26 MED ORDER — IRON (FERROUS SULFATE) 325 (65 FE) MG PO TABS: 325.0000 mg | ORAL_TABLET | Freq: Every day | ORAL | 3 refills | Status: AC

## 2024-05-31 ENCOUNTER — Ambulatory Visit (HOSPITAL_COMMUNITY)
Admission: RE | Admit: 2024-05-31 | Discharge: 2024-05-31 | Disposition: A | Discharge status: Home / Self Care | Source: Ambulatory Visit | Attending: Surgery | Admitting: Surgery

## 2024-05-31 DIAGNOSIS — R918 Other nonspecific abnormal finding of lung field: Secondary | ICD-10-CM | POA: Diagnosis not present

## 2024-05-31 DIAGNOSIS — J439 Emphysema, unspecified: Secondary | ICD-10-CM | POA: Diagnosis not present

## 2024-05-31 DIAGNOSIS — I2510 Atherosclerotic heart disease of native coronary artery without angina pectoris: Secondary | ICD-10-CM | POA: Diagnosis not present

## 2024-05-31 DIAGNOSIS — I700 Atherosclerosis of aorta: Secondary | ICD-10-CM | POA: Diagnosis not present

## 2024-05-31 DIAGNOSIS — J90 Pleural effusion, not elsewhere classified: Secondary | ICD-10-CM | POA: Insufficient documentation

## 2024-05-31 DIAGNOSIS — J432 Centrilobular emphysema: Secondary | ICD-10-CM | POA: Diagnosis not present

## 2024-05-31 DIAGNOSIS — J948 Other specified pleural conditions: Secondary | ICD-10-CM | POA: Insufficient documentation

## 2024-06-07 ENCOUNTER — Ambulatory Visit

## 2024-06-07 VITALS — BP 143/79 | HR 78 | Resp 18 | Ht 68.0 in

## 2024-06-07 DIAGNOSIS — J948 Other specified pleural conditions: Secondary | ICD-10-CM

## 2024-06-07 NOTE — Patient Instructions (Signed)
-  Follow up in 6 months with CT of chest

## 2024-06-07 NOTE — Progress Notes (Signed)
 [address] [place] [address]             [phone]       HPI: Mr. Martin Bowman is a 68 year old male with no significant medical history and is a former tobacco user who presents to the clinic for follow up of hydropneumothorax.    He was seen on 05/03/2024 for follow up of right sided pleural effusion which has resolved.  On chest x-ray there was a persistent oval region of consolidation localizing to the right lower lobe and CT scan was recommended for further evaluation.  He presents today to review CT scan results.  He reports that he is feeling better.  His fatigue has improved at this time.  He does have some lower leg cramping when attempting to walk short distances.  It resolves with resting for a few minutes.  He denies chest pain, shortness of breath and cough.   Current Outpatient Medications  Medication Sig Dispense Refill   Iron , Ferrous Sulfate , 325 (65 Fe) MG TABS Take 325 mg by mouth daily. 90 tablet 3   acetaminophen  (TYLENOL ) 325 MG tablet Take 2 tablets (650 mg total) by mouth every 6 (six) hours as needed for mild pain (pain score 1-3) or headache.     Glucosamine 500 MG CAPS Take 1,500 mg by mouth daily at 12 noon.     methylPREDNISolone  (MEDROL  DOSEPAK) 4 MG TBPK tablet Take 6 tablets on day one, take 5 tablets on day two, take 4 tablets on day three, take 3 tablets on day four, take 2 tablets on day five, take one tablet on day six 21 each 0   Multiple Vitamin (MULTIVITAMIN) tablet Take 1 tablet by mouth daily.     No current facility-administered medications for this visit.   Vitals:   06/07/24 1601  BP: (!) 143/79  Pulse: 78  Resp: 18  SpO2: 94%     Review of Systems  Constitutional: Negative.  Negative for malaise/fatigue and weight loss.  Respiratory: Negative.  Negative for cough, shortness of breath and wheezing.   Cardiovascular: Negative.  Negative for chest pain, palpitations and leg swelling.   Physical  Exam Constitutional:      Appearance: Normal appearance.  HENT:     Head: Normocephalic and atraumatic.  Cardiovascular:     Rate and Rhythm: Normal rate and regular rhythm.     Heart sounds: Normal heart sounds, S1 normal and S2 normal.  Pulmonary:     Effort: Pulmonary effort is normal.     Breath sounds: Normal breath sounds.  Skin:    General: Skin is warm and dry.  Neurological:     General: No focal deficit present.     Mental Status: He is alert and oriented to person, place, and time.       Diagnostic Tests: CLINICAL DATA:  Hydropneumothorax   EXAM: CT CHEST WITHOUT CONTRAST   TECHNIQUE: Multidetector CT imaging of the chest was performed following the standard protocol without IV contrast.   RADIATION DOSE REDUCTION: This exam was performed according to the departmental dose-optimization program which includes automated exposure control, adjustment of the mA and/or kV according to patient size and/or use of iterative reconstruction technique.   COMPARISON:  Chest CT 04/30/2023.  Chest x-ray 05/03/2024   FINDINGS: Cardiovascular: Heart is normal size. Aorta is normal caliber. Coronary artery and aortic calcifications.   Mediastinum/Nodes: No mediastinal, hilar, or axillary adenopathy. Trachea and  esophagus are unremarkable. Thyroid  unremarkable.   Lungs/Pleura: Mild centrilobular emphysema. Rounded density noted in the region of the right major fissure and extending into the superior segment of the right lower lobe. Given the history of hydropneumothorax, I favor this represents loculated fluid in the major fissure and tracking in an old chest tube tract. Adjacent atelectasis or scarring in the right lower lobe. No current effusions or pneumothorax. Left lung clear.   Upper Abdomen: Heterogeneous appearance of the liver. It is difficult to exclude ill-defined lesions throughout the liver.   Musculoskeletal: Chest wall soft tissues are unremarkable. No  acute bony abnormality.   IMPRESSION: Rounded density in the region of the right major fissure and tracking into the superior segment of the right lower lobe. I favor this reflects a combination of fluid within the major fissure and chest tube tract as well as adjacent right lower lobe atelectasis and/or scarring. This could be followed with repeat CT in 3-6 months to ensure stability or resolution.   Heterogeneous appearance of the liver with questionable ill-defined lesions throughout the liver. This could be further evaluated with right upper quadrant ultrasound or contrast-enhanced abdominal CT.   Coronary artery disease.   Aortic Atherosclerosis (ICD10-I70.0) and Emphysema (ICD10-J43.9).     Electronically Signed   By: Franky Crease M.D.   On: 06/05/2024 20:17  Plan:  Hydropneumothorax -The persistent oval region of consolidation that was seen on chest xray showed that it was fluid within the major fissure and chest tube tract with right lower lobe atelectasis on CT scan of chest   -Will follow up with CT of chest in 6 months for continued monitoring  -CT showed lesions on liver, discussed that he should reach out to his PCP for further evaluation and possible imaging -He should also discuss with his PCP the atherosclerotic changes seen on CT scan for possible initiation of statin therapy.  Patient also reported having lower leg cramping while attempting to walk that resolves with rest.  Discussed that this could be peripheral artery disease and would require further testing for diagnosis.    Manuelita CHRISTELLA Rough, PA-C [facility] [phone]

## 2024-06-18 ENCOUNTER — Telehealth: Payer: Self-pay

## 2024-06-18 NOTE — Telephone Encounter (Signed)
 Copied from CRM [id number]. Topic: Clinical - Lab/Test Results >> Jun 17, 2024  4:51 PM Martin Bowman wrote: Reason for CRM: pt called to request for Dr. Johnny to review his CT scan results and would like to know what his next steps should be. Please call and advise with pt.

## 2024-06-21 NOTE — Telephone Encounter (Signed)
 Spoke with pt advised per Dr Johnny to schedule an OV to discuss CT results, pt scheduled for 06/23/24

## 2024-06-21 NOTE — Telephone Encounter (Signed)
 The recent CT scan showed a number of abnormalities we need to discuss. Make an OV for him to come in and see me

## 2024-06-23 ENCOUNTER — Ambulatory Visit: Admitting: Family Medicine

## 2024-06-23 ENCOUNTER — Encounter: Payer: Self-pay | Admitting: Family Medicine

## 2024-06-23 VITALS — BP 140/76 | HR 75 | Temp 97.9°F | Wt 158.6 lb

## 2024-06-23 DIAGNOSIS — I700 Atherosclerosis of aorta: Secondary | ICD-10-CM

## 2024-06-23 DIAGNOSIS — I739 Peripheral vascular disease, unspecified: Secondary | ICD-10-CM | POA: Diagnosis not present

## 2024-06-23 DIAGNOSIS — K769 Liver disease, unspecified: Secondary | ICD-10-CM | POA: Diagnosis not present

## 2024-06-23 DIAGNOSIS — J948 Other specified pleural conditions: Secondary | ICD-10-CM

## 2024-06-23 MED ORDER — ATORVASTATIN CALCIUM 10 MG PO TABS
10.0000 mg | ORAL_TABLET | Freq: Every day | ORAL | 3 refills | Status: DC
  Filled 2024-09-22: qty 90, 90d supply, fill #0

## 2024-06-23 NOTE — Progress Notes (Signed)
   Subjective:    Patient ID: Martin Bowman, male    DOB: 1956-02-01, 68 y.o.   MRN: [id number]  HPI Here to follow up from a chest CT he had performed on 05-31-24, as ordered by Cardiovascular Surgery. This showed a fluid collection in the right fissure and the superior RLL which was felt to be the remnants of his recent hydropneumothorax and the use of a chest tube. A small right pleural effusion remains. However the scan also revealed aortic atherosclerosis as well as scattered small lesions in the liver. A follow up chest CT was recommended at 3-6 months to recheck the right sided fluid collections. He was advised to see us  about the liver lesions and the aortic atherosclerosis. He denies any sort of SOB or chest pain on exertion (prior to the recent issues). Lastly he had asked the CV provider about frequent cramps in his right calf when he walks more than short distances. They directed him to ask us  about this. No leg swelling.    Review of Systems  Constitutional: Negative.   Respiratory: Negative.    Cardiovascular: Negative.   Gastrointestinal: Negative.   Musculoskeletal:  Positive for myalgias.       Objective:   Physical Exam Constitutional:      Appearance: Normal appearance.  Cardiovascular:     Rate and Rhythm: Normal rate and regular rhythm.     Pulses: Normal pulses.     Heart sounds: Normal heart sounds.  Pulmonary:     Effort: Pulmonary effort is normal.     Breath sounds: Normal breath sounds.  Abdominal:     General: Abdomen is flat. Bowel sounds are normal. There is no distension.     Palpations: Abdomen is soft. There is no mass.     Tenderness: There is no abdominal tenderness. There is no right CVA tenderness, left CVA tenderness, guarding or rebound.     Hernia: No hernia is present.  Musculoskeletal:     Comments: The right lower leg and foot appear normal. The skin is warm and pink. No tenderness or swelling. The DP and PT pulses on both feet are somewhat  diminished.   Neurological:     Mental Status: He is alert.           Assessment & Plan:  For the hydropneumothorax, he seems to be healing as expected. He is now back to work with lifting restrictions. We will get a 6 month follow up chest CT to follow the right sided fluid collections. For the liver lesions, we will order an MRI of the liver with and without contrast. For the right calf pain, this could be claudication pain from PAD, so we will set up vascular US  with ABI. For the finding of aortic atherosclerosis, we will start him on Atorvastatin 10 mg daily.  Garnette Olmsted, MD

## 2024-06-29 ENCOUNTER — Ambulatory Visit: Admitting: Family Medicine

## 2024-06-29 DIAGNOSIS — Z0000 Encounter for general adult medical examination without abnormal findings: Secondary | ICD-10-CM | POA: Diagnosis not present

## 2024-06-29 NOTE — Patient Instructions (Signed)
 I really enjoyed getting to talk with you today! I am available on Tuesdays and Thursdays for virtual visits if you have any questions or concerns, or if I can be of any further assistance.   CHECKLIST FROM ANNUAL WELLNESS VISIT:  -Follow up (please call to schedule if not scheduled after visit):   -yearly for annual wellness visit with primary care office  Here is a list of your preventive care/health maintenance measures and the plan for each if any are due:  PLAN For any measures below that may be due:    1. Get vaccines at the pharmacy. When you do, please let us  know so that we can update your record.   2. Follow lung cancer screening program recs on lung cancer screening.   Health Maintenance  Topic Date Due   Influenza Vaccine  04/16/2024   COVID-19 Vaccine (3 - 2025-26 season) 05/17/2024   Medicare Annual Wellness (AWV)  06/11/2024   Zoster Vaccines- Shingrix (1 of 2) 07/21/2024 (Originally 09/30/2005)   Lung Cancer Screening  05/31/2025   DTaP/Tdap/Td (2 - Td or Tdap) 08/07/2027   Colonoscopy  06/15/2029   Pneumococcal Vaccine: 50+ Years  Completed   Hepatitis C Screening  Completed   Meningococcal B Vaccine  Aged Out    -See a dentist at least yearly  -Get your eyes checked and then per your eye specialist's recommendations  -Other issues addressed today:   -I have included below further information regarding a healthy whole foods based diet, physical activity guidelines for adults, stress management and opportunities for social connections. I hope you find this information useful.   -----------------------------------------------------------------------------------------------------------------------------------------------------------------------------------------------------------------------------------------------------------    NUTRITION: -eat real food: lots of colorful vegetables (half the plate) and fruits -5-7 servings of vegetables and fruits per day  (fresh or steamed is best), exp. 2 servings of vegetables with lunch and dinner and 2 servings of fruit per day. Berries and greens such as kale and collards are great choices.  -consume on a regular basis:  fresh fruits, fresh veggies, fish, nuts, seeds, healthy oils (such as olive oil, avocado oil), whole grains (make sure for bread/pasta/crackers/etc., that the first ingredient on label contains the word whole), legumes. -can eat small amounts of dairy and lean meat (no larger than the palm of your hand), but avoid processed meats such as ham, bacon, lunch meat, etc. -drink water -try to avoid fast food and pre-packaged foods, processed meat, ultra processed foods/beverages (donuts, candy, etc.) -most experts advise limiting sodium to < 2300mg  per day, should limit further is any chronic conditions such as high blood pressure, heart disease, diabetes, etc. The American Heart Association advised that < 1500mg  is is ideal -try to avoid foods/beverages that contain any ingredients with names you do not recognize  -try to avoid foods/beverages  with added sugar or sweeteners/sweets  -try to avoid sweet drinks (including diet drinks): soda, juice, Gatorade, sweet tea, power drinks, diet drinks -try to avoid white rice, white bread, pasta (unless whole grain)  EXERCISE GUIDELINES FOR ADULTS: -if you wish to increase your physical activity, do so gradually and with the approval of your doctor -STOP and seek medical care immediately if you have any chest pain, chest discomfort or trouble breathing when starting or increasing exercise  -move and stretch your body, legs, feet and arms when sitting for long periods -Physical activity guidelines for optimal health in adults: -get at least 150 minutes per week of moderate exercise (can talk, but not sing); this is about 20-30  minutes of sustained activity 5-7 days per week or two 10-15 minute episodes of sustained activity 5-7 days per week -do some muscle  building/resistance training/strength training at least 2 days per week  -balance exercises 3+ days per week:   Stand somewhere where you have something sturdy to hold onto if you lose balance    1) lift up on toes, then back down, start with 5x per day and work up to 20x   2) stand and lift one leg straight out to the side so that foot is a few inches of the floor, start with 5x each side and work up to 20x each side   3) stand on one foot, start with 5 seconds each side and work up to 20 seconds on each side  If you need ideas or help with getting more active:  -Silver sneakers https://tools.silversneakers.com  -Walk with a Doc: http://www.duncan-williams.com/  -try to include resistance (weight lifting/strength building) and balance exercises twice per week: or the following link for ideas: http://castillo-powell.com/  BuyDucts.dk  STRESS MANAGEMENT: -can try meditating, or just sitting quietly with deep breathing while intentionally relaxing all parts of your body for 5 minutes daily -if you need further help with stress, anxiety or depression please follow up with your primary doctor or contact the wonderful folks at [facility]: [phone]  SOCIAL CONNECTIONS: -options in [place] if you wish to engage in more social and exercise related activities:  -Silver sneakers https://tools.silversneakers.com  -Walk with a Doc: http://www.duncan-williams.com/  -Check out the [facility] Active Adults 50+ section on the [place] of [employer] (hiking clubs, book clubs, cards and games, chess, exercise classes, aquatic classes and much more) - see the website for details: https://www.[place].gov/departments/parks-recreation/active-adults50  -YouTube has lots of exercise videos for different ages and abilities as well  -[facility] (a variety of indoor and outdoor  inperson activities for adults). [phone]. [address]  -Virtual Online Classes (a variety of topics): see seniorplanet.org or call [phone]  -consider volunteering at a school, hospice center, church, senior center or elsewhere

## 2024-06-29 NOTE — Progress Notes (Signed)
 PATIENT CHECK-IN and HEALTH RISK ASSESSMENT QUESTIONNAIRE:   Pre-Visit Check-in: 1)Vitals (height, wt, BP, etc) - record in vitals section for visit on day of visit Request home vitals (wt, BP, etc.) and enter into vitals, THEN update Vital Signs SmartPhrase below at the top of the HPI. See below.  2)Review and Update Medications, Allergies PMH, Surgeries, Social history in Epic 3)Hospitalizations in the last year with date/reason? One for collapsed lung  4)Review and Update Care Team (patient's specialists) in Epic 5) Complete PHQ9 in Epic  6) Complete Fall Screening in Epic 7)Review all Health Maintenance Due and order if not done.  Medicare Wellness Patient Questionnaire:  Answer theses question about your habits: How often do you have a drink containing alcohol?n How many drinks containing alcohol do you have on a typical day when you are drinking?na How often do you have six or more drinks on one occasion?na Have you ever smoked?y Quit date if applicable? 2022  How many packs a day do/did you smoke? 2 ppd Do you use smokeless tobacco?n Do you use an illicit drugs?n On average, how many days per week do you engage in moderate to strenuous exercise (like a brisk walk)?active at job, walks some Typical breakfast: usually skips Typical lunch: Fast food - usually sandwich, fries Typical dinner: dinner - junk food, not healthy, veggies some proteins Typical snacks:some candy  Beverages: coffee, not much water, boost, gatorade  Answer theses question about your everyday activities: Can you perform most household chores?y Are you deaf or have significant trouble hearing?n Do you feel that you have a problem with memory?not as good as it used to be Do you feel safe at home?y Last dentist visit?dentures 8. Do you have any difficulty performing your everyday activities?n Are you having any difficulty walking, taking medications on your own, and or difficulty managing daily home  needs?n Do you have difficulty walking or climbing stairs?n Do you have difficulty dressing or bathing?n Do you have difficulty doing errands alone such as visiting a doctor's office or shopping?n Do you currently have any difficulty preparing food and eating?n Do you currently have any difficulty using the toilet?n Do you have any difficulty managing your finances?n Do you have any difficulties with housekeeping of managing your housekeeping?n   Do you have Advanced Directives in place (Living Will, Healthcare Power or Attorney)?  y   Last eye Exam and location? [facility], had appt in July    Do you currently use prescribed or non-prescribed narcotic or opioid pain medications?n  Do you have a history or close family history of breast, ovarian, tubal or peritoneal cancer or a family member with BRCA (breast cancer susceptibility 1 and 2) gene mutations? See PMH/FH     ----------------------------------------------------------------------------------------------------------------------------------------------------------------------------------------------------------------------  Because this visit was a virtual/telehealth visit, some criteria may be missing or patient reported. Any vitals not documented were not able to be obtained and vitals that have been documented are patient reported.    MEDICARE ANNUAL PREVENTIVE CARE VISIT WITH PROVIDER (Welcome to Medicare, initial annual wellness or annual wellness exam)  Virtual Visit via Phone Note  I connected with Martin Bowman on 06/29/24  by phone  and verified that I am speaking with the correct person using two identifiers.He prefers a phone visit.   Location patient: home Location provider:work or home office Persons participating in the virtual visit: patient, provider  Concerns and/or follow up today: reports nothing new since recent visit with Dr. Johnny.    See HM  section in Epic for other details of completed HM.     ROS: negative for report of fevers, unintentional weight loss, vision changes, vision loss, hearing loss or change, chest pain, sob, hemoptysis, melena, hematochezia, hematuria, falls, bleeding or bruising, thoughts of suicide or self harm, memory loss  Patient-completed extensive health risk assessment - reviewed and discussed with the patient: See Health Risk Assessment completed with patient prior to the visit either above or in recent phone note. This was reviewed in detailed with the patient today and appropriate recommendations, orders and referrals were placed as needed per Summary below and patient instructions.   Review of Medical History: -PMH, PSH, Family History and current specialty and care providers reviewed and updated and listed below   Patient Care Team: Johnny Garnette LABOR, MD as PCP - General   Past Medical History:  Diagnosis Date   Urinary incontinence     Past Surgical History:  Procedure Laterality Date   COLONOSCOPY  05/19/2019   per Dr. Rollin, benign polyps, repeat in 10 yrs    left inguinal hernia repair and umbilical hernia repiar     Dr. Mikell   TONSILLECTOMY     VASECTOMY     Dr. Mardy    Social History   Socioeconomic History   Marital status: Significant Other    Spouse name: Not on file   Number of children: Not on file   Years of education: Not on file   Highest education level: Not on file  Occupational History   Not on file  Tobacco Use   Smoking status: Every Day    Current packs/day: 0.00    Average packs/day: 1 pack/day for 40.0 years (40.0 ttl pk-yrs)    Types: Cigarettes    Start date: 05/19/1973    Last attempt to quit: 05/19/2013    Years since quitting: 11.1   Smokeless tobacco: Never  Substance and Sexual Activity   Alcohol use: No    Alcohol/week: 0.0 standard drinks of alcohol   Drug use: No   Sexual activity: Not on file  Other Topics Concern   Not on file  Social History Narrative   Not on file   Social Drivers  of Health   Financial Resource Strain: Low Risk  (06/12/2023)   Overall Financial Resource Strain (CARDIA)    Difficulty of Paying Living Expenses: Not hard at all  Food Insecurity: No Food Insecurity (04/07/2024)   Hunger Vital Sign    Worried About Running Out of Food in the Last Year: Never true    Ran Out of Food in the Last Year: Never true  Transportation Needs: No Transportation Needs (04/07/2024)   PRAPARE - Administrator, Civil Service (Medical): No    Lack of Transportation (Non-Medical): No  Physical Activity: Inactive (06/12/2023)   Exercise Vital Sign    Days of Exercise per Week: 0 days    Minutes of Exercise per Session: 0 min  Stress: No Stress Concern Present (06/12/2023)   [employer] of Occupational Health - Occupational Stress Questionnaire    Feeling of Stress : Not at all  Social Connections: Socially Isolated (04/07/2024)   Social Connection and Isolation Panel    Frequency of Communication with Friends and Family: Three times a week    Frequency of Social Gatherings with Friends and Family: Once a week    Attends Religious Services: Never    Database administrator or Organizations: No    Attends Banker Meetings:  Never    Marital Status: Never married  Intimate Partner Violence: Not At Risk (04/07/2024)   Humiliation, Afraid, Rape, and Kick questionnaire    Fear of Current or Ex-Partner: No    Emotionally Abused: No    Physically Abused: No    Sexually Abused: No    No family history on file.  Current Outpatient Medications on File Prior to Visit  Medication Sig Dispense Refill   acetaminophen  (TYLENOL ) 325 MG tablet Take 2 tablets (650 mg total) by mouth every 6 (six) hours as needed for mild pain (pain score 1-3) or headache.     atorvastatin (LIPITOR) 10 MG tablet Take 1 tablet (10 mg total) by mouth daily. 90 tablet 3   Glucosamine 500 MG CAPS Take 1,500 mg by mouth daily at 12 noon.     Iron , Ferrous Sulfate , 325 (65 Fe)  MG TABS Take 325 mg by mouth daily. 90 tablet 3   Multiple Vitamin (MULTIVITAMIN) tablet Take 1 tablet by mouth daily.     No current facility-administered medications on file prior to visit.    No Known Allergies     Physical Exam Vitals requested from patient and listed below if patient had equipment and was able to obtain at home for this virtual visit: There were no vitals filed for this visit. Estimated body mass index is 24.12 kg/m as calculated from the following:   Height as of 06/07/24: 5' 8 (1.727 m).   Weight as of 06/23/24: 158 lb 9.6 oz (71.9 kg).  EKG (optional): deferred due to virtual visit  GENERAL: alert, oriented, no acute distress detected; full vision exam deferred due to pandemic and/or virtual encounter  PSYCH/NEURO: pleasant and cooperative, no obvious depression or anxiety, speech and thought processing grossly intact, Cognitive function grossly intact  Flowsheet Row Office Visit from 04/20/2024 in [facility]  PHQ-9 Total Score 0        06/29/2024    5:06 PM 04/20/2024    8:51 AM 04/20/2024    8:50 AM 06/12/2023    4:42 PM 04/10/2023    8:08 AM  Depression screen PHQ 2/9  Decreased Interest 0 0 0 0 0  Down, Depressed, Hopeless 0 0 0 0 0  PHQ - 2 Score 0 0 0 0 0  Altered sleeping  0 0  0  Tired, decreased energy  0   0  Change in appetite  0   0  Feeling bad or failure about yourself   0   0  Trouble concentrating  0   0  Moving slowly or fidgety/restless  0   0  Suicidal thoughts  0   0  PHQ-9 Score  0 0  0  Difficult doing work/chores     Not difficult at all       12/31/2021    8:03 AM 04/10/2023    8:08 AM 06/12/2023    4:42 PM 04/20/2024    8:50 AM 06/29/2024    5:05 PM  Fall Risk  Falls in the past year? 1 0 0 0 0  Was there an injury with Fall? 0 0 0 0 0  Fall Risk Category Calculator 1 0 0 0 0  Fall Risk Category (Retired) Low       (RETIRED) Patient Fall Risk Level Low fall risk       Patient at Risk  for Falls Due to No Fall Risks No Fall Risks No Fall Risks No Fall Risks  Fall risk Follow up Falls evaluation completed  Falls evaluation completed Falls evaluation completed Falls evaluation completed Falls evaluation completed     Data saved with a previous flowsheet row definition     SUMMARY AND PLAN:  Encounter for Medicare annual wellness exam  Discussed applicable health maintenance/preventive health measures and advised and referred or ordered per patient preferences: -discussed vaccines due recs and risks, he is considering getting at the pharmacy -discussed lung cancer screening, he had  CT recently and plans to follow up as advised with pulm office - reports will be doing repeat in 6 months Health Maintenance  Topic Date Due   Influenza Vaccine  04/16/2024   COVID-19 Vaccine (3 - 2025-26 season) 05/17/2024   Zoster Vaccines- Shingrix (1 of 2) 07/21/2024 (Originally 09/30/2005)   Lung Cancer Screening  05/31/2025   Medicare Annual Wellness (AWV)  06/29/2025   DTaP/Tdap/Td (2 - Td or Tdap) 08/07/2027   Colonoscopy  06/15/2029   Pneumococcal Vaccine: 50+ Years  Completed   Hepatitis C Screening  Completed   Meningococcal B Vaccine  Aged Out     Education and counseling on the following was provided based on the above review of health and a plan/checklist for the patient, along with additional information discussed, was provided for the patient in the patient instructions :  -Advised on importance of completing advanced directives, he thinks he did, requested copy for Dr. Johnny -discussed memory - is occ and things come to him, advised if concerned or any worsening to schedule in office evaluation -Advised and counseled on a healthy lifestyle  -Reviewed patient's current diet. Advised and counseled on a whole foods based healthy diet. Encouraged to switch to water and discussed less processed/healthier options when eating out, advised at home to choose healthy proteins,  veggies, fruits, whole grains. A summary of a healthy diet was provided in the Patient Instructions.  -reviewed patient's current physical activity level and discussed exercise guidelines for adults. Discussed community resources and ideas for safe exercise at home to assist in meeting exercise guideline recommendations in a safe and healthy way.  -Advise yearly dental visits at minimum and regular eye exams   Follow up: see patient instructions   Patient Instructions  I really enjoyed getting to talk with you today! I am available on Tuesdays and Thursdays for virtual visits if you have any questions or concerns, or if I can be of any further assistance.   CHECKLIST FROM ANNUAL WELLNESS VISIT:  -Follow up (please call to schedule if not scheduled after visit):   -yearly for annual wellness visit with primary care office  Here is a list of your preventive care/health maintenance measures and the plan for each if any are due:  PLAN For any measures below that may be due:    1. Get vaccines at the pharmacy. When you do, please let us  know so that we can update your record.   2. Follow lung cancer screening program recs on lung cancer screening.   Health Maintenance  Topic Date Due   Influenza Vaccine  04/16/2024   COVID-19 Vaccine (3 - 2025-26 season) 05/17/2024   Medicare Annual Wellness (AWV)  06/11/2024   Zoster Vaccines- Shingrix (1 of 2) 07/21/2024 (Originally 09/30/2005)   Lung Cancer Screening  05/31/2025   DTaP/Tdap/Td (2 - Td or Tdap) 08/07/2027   Colonoscopy  06/15/2029   Pneumococcal Vaccine: 50+ Years  Completed   Hepatitis C Screening  Completed   Meningococcal B Vaccine  Aged Out    -  See a dentist at least yearly  -Get your eyes checked and then per your eye specialist's recommendations  -Other issues addressed today:   -I have included below further information regarding a healthy whole foods based diet, physical activity guidelines for adults, stress  management and opportunities for social connections. I hope you find this information useful.   -----------------------------------------------------------------------------------------------------------------------------------------------------------------------------------------------------------------------------------------------------------    NUTRITION: -eat real food: lots of colorful vegetables (half the plate) and fruits -5-7 servings of vegetables and fruits per day (fresh or steamed is best), exp. 2 servings of vegetables with lunch and dinner and 2 servings of fruit per day. Berries and greens such as kale and collards are great choices.  -consume on a regular basis:  fresh fruits, fresh veggies, fish, nuts, seeds, healthy oils (such as olive oil, avocado oil), whole grains (make sure for bread/pasta/crackers/etc., that the first ingredient on label contains the word whole), legumes. -can eat small amounts of dairy and lean meat (no larger than the palm of your hand), but avoid processed meats such as ham, bacon, lunch meat, etc. -drink water -try to avoid fast food and pre-packaged foods, processed meat, ultra processed foods/beverages (donuts, candy, etc.) -most experts advise limiting sodium to < 2300mg  per day, should limit further is any chronic conditions such as high blood pressure, heart disease, diabetes, etc. The American Heart Association advised that < 1500mg  is is ideal -try to avoid foods/beverages that contain any ingredients with names you do not recognize  -try to avoid foods/beverages  with added sugar or sweeteners/sweets  -try to avoid sweet drinks (including diet drinks): soda, juice, Gatorade, sweet tea, power drinks, diet drinks -try to avoid white rice, white bread, pasta (unless whole grain)  EXERCISE GUIDELINES FOR ADULTS: -if you wish to increase your physical activity, do so gradually and with the approval of your doctor -STOP and seek medical care  immediately if you have any chest pain, chest discomfort or trouble breathing when starting or increasing exercise  -move and stretch your body, legs, feet and arms when sitting for long periods -Physical activity guidelines for optimal health in adults: -get at least 150 minutes per week of moderate exercise (can talk, but not sing); this is about 20-30 minutes of sustained activity 5-7 days per week or two 10-15 minute episodes of sustained activity 5-7 days per week -do some muscle building/resistance training/strength training at least 2 days per week  -balance exercises 3+ days per week:   Stand somewhere where you have something sturdy to hold onto if you lose balance    1) lift up on toes, then back down, start with 5x per day and work up to 20x   2) stand and lift one leg straight out to the side so that foot is a few inches of the floor, start with 5x each side and work up to 20x each side   3) stand on one foot, start with 5 seconds each side and work up to 20 seconds on each side  If you need ideas or help with getting more active:  -Silver sneakers https://tools.silversneakers.com  -Walk with a Doc: http://www.duncan-williams.com/  -try to include resistance (weight lifting/strength building) and balance exercises twice per week: or the following link for ideas: http://castillo-powell.com/  BuyDucts.dk  STRESS MANAGEMENT: -can try meditating, or just sitting quietly with deep breathing while intentionally relaxing all parts of your body for 5 minutes daily -if you need further help with stress, anxiety or depression please follow up with your primary doctor  or contact the wonderful folks at [facility]: [phone]  SOCIAL CONNECTIONS: -options in [place] if you wish to engage in more social and exercise related activities:  -Silver  sneakers https://tools.silversneakers.com  -Walk with a Doc: http://www.duncan-williams.com/  -Check out the [facility] Active Adults 50+ section on the [place] of [employer] (hiking clubs, book clubs, cards and games, chess, exercise classes, aquatic classes and much more) - see the website for details: https://www.[place].gov/departments/parks-recreation/active-adults50  -YouTube has lots of exercise videos for different ages and abilities as well  -[facility] (a variety of indoor and outdoor inperson activities for adults). [phone]. [address]  -Virtual Online Classes (a variety of topics): see seniorplanet.org or call [phone]  -consider volunteering at a school, hospice center, church, senior center or elsewhere            Chiquita JONELLE Cramp, DO

## 2024-07-09 ENCOUNTER — Ambulatory Visit
Admission: RE | Admit: 2024-07-09 | Discharge: 2024-07-09 | Disposition: A | Discharge status: Home / Self Care | Source: Ambulatory Visit | Attending: Family Medicine | Admitting: Family Medicine

## 2024-07-09 DIAGNOSIS — I7143 Infrarenal abdominal aortic aneurysm, without rupture: Secondary | ICD-10-CM | POA: Diagnosis not present

## 2024-07-09 DIAGNOSIS — K769 Liver disease, unspecified: Secondary | ICD-10-CM

## 2024-07-09 MED ORDER — GADOPICLENOL 0.5 MMOL/ML IV SOLN
7.0000 mL | Freq: Once | INTRAVENOUS | Status: AC | PRN
  Administered 2024-07-09: 7 mL via INTRAVENOUS

## 2024-07-12 ENCOUNTER — Ambulatory Visit: Payer: Self-pay | Admitting: Family Medicine

## 2024-07-13 ENCOUNTER — Encounter: Payer: Self-pay | Admitting: Family Medicine

## 2024-07-13 DIAGNOSIS — I716 Thoracoabdominal aortic aneurysm, without rupture, unspecified: Secondary | ICD-10-CM | POA: Insufficient documentation

## 2024-07-13 DIAGNOSIS — I700 Atherosclerosis of aorta: Secondary | ICD-10-CM

## 2024-07-13 NOTE — Telephone Encounter (Signed)
 I don't think this is too serious of an issue. The Cardiothoracic Surgery team saw this on a chest CT he had on 05-31-24, so they were aware of it. However just to be complete, I will refer him back to them to assess this further

## 2024-07-15 ENCOUNTER — Telehealth: Payer: Self-pay

## 2024-07-15 DIAGNOSIS — I700 Atherosclerosis of aorta: Secondary | ICD-10-CM

## 2024-07-15 NOTE — Telephone Encounter (Signed)
 Pt message sent to  PCP for advise ?

## 2024-07-15 NOTE — Telephone Encounter (Signed)
 Message sent to PCP for advise ?

## 2024-07-15 NOTE — Telephone Encounter (Signed)
 Copied from CRM [id number]. Topic: Clinical - Lab/Test Results >> Jul 12, 2024  8:30 AM Deaijah H wrote: Reason for CRM: Patients fianc would like to speak with Dr. Johnny or his nurse regarding one thing on MRI report. Stated they were very concerned about it. Please call [phone]

## 2024-07-16 NOTE — Addendum Note (Signed)
 Addended by: JOHNNY SENIOR A on: 07/16/2024 07:57 AM   Modules accepted: Orders

## 2024-07-16 NOTE — Telephone Encounter (Signed)
I did the referral to Cardiology  

## 2024-07-22 ENCOUNTER — Encounter (HOSPITAL_BASED_OUTPATIENT_CLINIC_OR_DEPARTMENT_OTHER): Payer: Self-pay

## 2024-07-23 ENCOUNTER — Ambulatory Visit (HOSPITAL_BASED_OUTPATIENT_CLINIC_OR_DEPARTMENT_OTHER)

## 2024-07-23 DIAGNOSIS — I739 Peripheral vascular disease, unspecified: Secondary | ICD-10-CM | POA: Diagnosis not present

## 2024-07-23 NOTE — Addendum Note (Signed)
 Addended by: JOHNNY SENIOR A on: 07/23/2024 03:41 PM   Modules accepted: Orders

## 2024-07-24 LAB — VAS US ABI WITH/WO TBI
Left ABI: 0.73
Right ABI: 0.7

## 2024-08-04 NOTE — Telephone Encounter (Signed)
 Spoke with Jodie pt spouse voiced understanding of the reasons for cardiology referral.

## 2024-08-04 NOTE — Telephone Encounter (Signed)
 Copied from CRM [id number]. Topic: Referral - Question >> Aug 04, 2024 10:51 AM Pinkey ORN wrote: Reason for CRM: Referral Question >> Aug 04, 2024 10:53 AM Pinkey ORN wrote: Patient called, states he just spoke with Inocente in regards to his referral but was wanting clear understanding of what the referral was for. Please follow up with patient's fiance Jodie [phone]

## 2024-08-04 NOTE — Telephone Encounter (Signed)
 Spoke with office referral coordinator who checked on pt cardiology referral, advised to have pt call [facility]. Pt advised to call them for scheduling voiced understanding

## 2024-08-04 NOTE — Telephone Encounter (Signed)
 Pt called back and would like nancy to return his call again his questions

## 2024-08-16 ENCOUNTER — Ambulatory Visit: Attending: Surgery | Admitting: Surgery

## 2024-08-16 ENCOUNTER — Other Ambulatory Visit (HOSPITAL_COMMUNITY): Payer: Self-pay

## 2024-08-16 ENCOUNTER — Encounter: Payer: Self-pay | Admitting: Surgery

## 2024-08-16 VITALS — BP 150/82 | HR 78 | Temp 97.8°F | Ht 68.0 in | Wt 168.7 lb

## 2024-08-16 DIAGNOSIS — I7143 Infrarenal abdominal aortic aneurysm, without rupture: Secondary | ICD-10-CM

## 2024-08-16 DIAGNOSIS — I70213 Atherosclerosis of native arteries of extremities with intermittent claudication, bilateral legs: Secondary | ICD-10-CM | POA: Diagnosis not present

## 2024-08-16 MED ORDER — CILOSTAZOL 100 MG PO TABS
100.0000 mg | ORAL_TABLET | Freq: Two times a day (BID) | ORAL | 11 refills | Status: AC
  Filled 2024-08-16: qty 60, 30d supply, fill #0
  Filled 2024-09-18: qty 60, 30d supply, fill #1
  Filled 2024-10-14: qty 60, 30d supply, fill #2

## 2024-08-16 NOTE — Progress Notes (Signed)
 [facility]  Patient name: Martin Bowman MRN: [id number] DOB: 1956-07-07 Sex: male   REQUESTING PROVIDER:    Dr. Johnny   REASON FOR CONSULT:    PAD  HISTORY OF PRESENT ILLNESS:   Martin Bowman is a 68 y.o. male, who is referred for evaluation of peripheral vascular disease.  He comes in with abnormal ABIs in the 0.7 range.  He says that he gets right leg cramping at approximately 10 to 15 minutes of walking.  This will go away with rest.  It affects him fairly consistently.  He denies rest pain or nonhealing wounds.  He recently had a CT scan that showed a 3 cm infrarenal abdominal aortic aneurysm.  He has a history of a spontaneous pneumothorax.  He was recently started on Lipitor for hypercholesterolemia.  He is a former smoker.  PAST MEDICAL HISTORY    Past Medical History:  Diagnosis Date   Urinary incontinence      FAMILY HISTORY   History reviewed. No pertinent family history.  SOCIAL HISTORY:   Social History   Socioeconomic History   Marital status: Significant Other    Spouse name: Not on file   Number of children: Not on file   Years of education: Not on file   Highest education level: Not on file  Occupational History   Not on file  Tobacco Use   Smoking status: Former    Current packs/day: 0.00    Average packs/day: 1 pack/day for 40.0 years (40.0 ttl pk-yrs)    Types: Cigarettes    Start date: 05/19/1973    Quit date: 05/19/2013    Years since quitting: 11.2   Smokeless tobacco: Never  Substance and Sexual Activity   Alcohol use: No    Alcohol/week: 0.0 standard drinks of alcohol   Drug use: No   Sexual activity: Not on file  Other Topics Concern   Not on file  Social History Narrative   Not on file   Social Drivers of Health   Financial Resource Strain: Low Risk  (06/12/2023)   Overall Financial Resource Strain (CARDIA)    Difficulty of Paying Living Expenses: Not hard at all   Food Insecurity: No Food Insecurity (04/07/2024)   Hunger Vital Sign    Worried About Running Out of Food in the Last Year: Never true    Ran Out of Food in the Last Year: Never true  Transportation Needs: No Transportation Needs (04/07/2024)   PRAPARE - Administrator, Civil Service (Medical): No    Lack of Transportation (Non-Medical): No  Physical Activity: Inactive (06/12/2023)   Exercise Vital Sign    Days of Exercise per Week: 0 days    Minutes of Exercise per Session: 0 min  Stress: No Stress Concern Present (06/12/2023)   [employer] of Occupational Health - Occupational Stress Questionnaire    Feeling of Stress : Not at all  Social Connections: Socially Isolated (04/07/2024)   Social Connection and Isolation Panel    Frequency of Communication with Friends and Family: Three times a week    Frequency of Social Gatherings with Friends and Family: Once a week    Attends Religious Services: Never    Database Administrator or Organizations: No    Attends Banker Meetings: Never    Marital Status: Never married  Intimate Partner Violence: Not At Risk (04/07/2024)   Humiliation, Afraid, Rape, and Kick questionnaire    Fear of  Current or Ex-Partner: No    Emotionally Abused: No    Physically Abused: No    Sexually Abused: No    ALLERGIES:    No Known Allergies  CURRENT MEDICATIONS:    Current Outpatient Medications  Medication Sig Dispense Refill   acetaminophen  (TYLENOL ) 325 MG tablet Take 2 tablets (650 mg total) by mouth every 6 (six) hours as needed for mild pain (pain score 1-3) or headache.     atorvastatin  (LIPITOR) 10 MG tablet Take 1 tablet (10 mg total) by mouth daily. 90 tablet 3   Glucosamine 500 MG CAPS Take 1,500 mg by mouth daily at 12 noon.     Iron , Ferrous Sulfate , 325 (65 Fe) MG TABS Take 325 mg by mouth daily. 90 tablet 3   Multiple Vitamin (MULTIVITAMIN) tablet Take 1 tablet by mouth daily.     No current  facility-administered medications for this visit.    REVIEW OF SYSTEMS:   [X]  denotes positive finding, [ ]  denotes negative finding Cardiac  Comments:  Chest pain or chest pressure:    Shortness of breath upon exertion:    Short of breath when lying flat:    Irregular heart rhythm:        Vascular    Pain in calf, thigh, or hip brought on by ambulation: x   Pain in feet at night that wakes you up from your sleep:     Blood clot in your veins:    Leg swelling:         Pulmonary    Oxygen at home:    Productive cough:     Wheezing:         Neurologic    Sudden weakness in arms or legs:     Sudden numbness in arms or legs:     Sudden onset of difficulty speaking or slurred speech:    Temporary loss of vision in one eye:     Problems with dizziness:         Gastrointestinal    Blood in stool:      Vomited blood:         Genitourinary    Burning when urinating:     Blood in urine:        Psychiatric    Major depression:         Hematologic    Bleeding problems:    Problems with blood clotting too easily:        Skin    Rashes or ulcers:        Constitutional    Fever or chills:     PHYSICAL EXAM:   Vitals:   08/16/24 1420  BP: (!) 150/82  Pulse: 78  Temp: 97.8 F (36.6 C)  SpO2: 96%  Weight: 168 lb 11.2 oz (76.5 kg)  Height: 5' 8 (1.727 m)    GENERAL: The patient is a well-nourished male, in no acute distress. The vital signs are documented above. CARDIAC: There is a regular rate and rhythm.  VASCULAR: No carotid bruits.  Nonpalpable pedal pulses.  Palpable femoral pulses PULMONARY: Nonlabored respirations ABDOMEN: Soft and non-tender MUSCULOSKELETAL: There are no major deformities or cyanosis. NEUROLOGIC: No focal weakness or paresthesias are detected. SKIN: There are no ulcers or rashes noted. PSYCHIATRIC: The patient has a normal affect.  STUDIES:   I have reviewed the following:  ABI/TBIToday's ABIToday's TBIPrevious ABIPrevious  TBI  +-------+-----------+-----------+------------+------------+  Right .70        .50                                  +-------+-----------+-----------+------------+------------+  Left  .73        .46                                  +-------+-----------+-----------+------------+------------+    ASSESSMENT and PLAN   Carotid: The patient is asymptomatic.  His most recent duplex was several years ago.  I will repeat this in 3 months  AAA: MRI shows a 3 cm infrarenal abdominal aortic aneurysm.  I will perform routine surveillance, with the next study roughly in the next 1 to 2 years  PAD: Patient suffers from right leg claudication.  He has ABIs in the 0.7 range bilaterally.  I discussed that we will initially treat him medically.  This includes starting a 81 mg aspirin, targeting a LDL cholesterol less than 55, blood pressure control, and abstinence from tobacco products.  I am starting him on cilostazol.  I am also referring him to our pharmacy program to assist with medical management.  I will see him back in 6 months for follow-up   Malvina Serene CLORE, MD, FACS [facility] [phone] Pager [phone]

## 2024-08-19 ENCOUNTER — Other Ambulatory Visit: Payer: Self-pay | Admitting: *Deleted

## 2024-08-19 DIAGNOSIS — M79606 Pain in leg, unspecified: Secondary | ICD-10-CM

## 2024-08-19 DIAGNOSIS — I6523 Occlusion and stenosis of bilateral carotid arteries: Secondary | ICD-10-CM

## 2024-08-25 NOTE — Progress Notes (Signed)
 Ben Jannatul Wojdyla D.CLEMENTEEN [facility] [address] [place] [address] Phone: [phone]   Assessment and Plan:     1. Chronic pain of left knee (Primary) 2. 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  If still having pain after 2 weeks, complete 3rd-week of NSAID. May use remaining NSAID as needed once daily for pain control.  Do not to use additional over-the-counter NSAIDs (ibuprofen, naproxen, Advil, Aleve, etc.) while taking prescription NSAIDs.  May use Tylenol  [phone] mg 2 to 3 times a day for breakthrough pain. - Start HEP  3. Right elbow pain 4. 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  If still having pain after 2 weeks, complete 3rd-week of NSAID. May use remaining NSAID as needed once daily for pain control.  Do not to use additional over-the-counter NSAIDs (ibuprofen, naproxen, Advil, Aleve, etc.) while taking prescription NSAIDs.  May use Tylenol  [phone] mg 2 to 3 times a day for breakthrough pain. - Start HEP  5. 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  If still having pain after 2 weeks, complete 3rd-week of NSAID. May use remaining NSAID as needed once daily for pain control.  Do not to use additional over-the-counter NSAIDs (ibuprofen, naproxen, Advil, Aleve, etc.) while taking prescription NSAIDs.  May  use Tylenol  [phone] mg 2 to 3 times a day for breakthrough pain. - Start HEP    15 additional minutes spent for educating Therapeutic Home Exercise Program.  This included exercises focusing on stretching, strengthening, with focus on eccentric aspects.   Long term goals include an improvement in range of motion, strength, endurance as well as avoiding reinjury. Patient's frequency would include in 1-2 times a day, 3-5 times a week for a duration of 6-12 weeks. Proper technique shown and discussed handout in great detail with ATC.  All questions were discussed and answered.  X-rays obtained in clinic.  My interpretation: No acute fracture or dislocation  Pertinent previous records reviewed include none     Follow Up: 6 weeks for reevaluation.  Could consider physical therapy versus CSI versus prednisone  course versus advanced imaging   Subjective:   I, Moenique Parris, am serving as a neurosurgeon for Doctor Morene Mace  Chief Complaint: left knee and right elbow pain   HPI:  08/26/2024 Patient is a 68 year old male with left knee pain. Patient states left knee pain started weeks ago. He was turning a jack. Patellar tendon pain. Advil for the pain doesn't seem to help . No radiating pain. No numbness or tingling. ROM WNL. Pain when he squats on his knee.   Right elbow pain for a while. Pain when he applies pressure. Pain will resolve then he will bump it and pain comes back . No numbness or tingling. No radiating pain. No decrease in grip strength.   Right forearm intermittent pain. Decreased grip strength. 6-8 months of pain. No MOI. No numbness or tingling  Relevant Historical Information: None pertinent  Additional pertinent review of systems negative.   Current Outpatient Medications:    meloxicam (MOBIC) 15 MG tablet, Take 1 tablet daily for 2 weeks.  If still in pain after 2 weeks, take 1 tablet daily for an additional 1 week., Disp: 30 tablet, Rfl: 0   acetaminophen  (TYLENOL )  325 MG tablet, Take 2 tablets (650 mg total) by mouth every 6 (six) hours as needed for mild pain (pain score 1-3) or headache., Disp: , Rfl:    atorvastatin  (LIPITOR) 10 MG tablet, Take 1 tablet (10 mg total) by mouth daily., Disp: 90 tablet, Rfl: 3   cilostazol  (PLETAL ) 100 MG tablet, Take 1 tablet (100 mg total) by mouth 2 (two) times daily before a meal., Disp: 60 tablet, Rfl: 11   Glucosamine 500 MG CAPS, Take 1,500 mg by mouth daily at 12 noon., Disp: , Rfl:    Iron , Ferrous Sulfate , 325 (65 Fe) MG TABS, Take 325 mg by mouth daily., Disp: 90 tablet, Rfl: 3   Multiple Vitamin (MULTIVITAMIN) tablet, Take 1 tablet by mouth daily., Disp: , Rfl:    Objective:     Vitals:   08/26/24 1528  Pulse: 81  SpO2: 98%  Weight: 168 lb (76.2 kg)  Height: 5' 8 (1.727 m)      Body mass index is 25.54 kg/m.    Physical Exam:    General:  awake, alert oriented, no acute distress nontoxic Skin: no suspicious lesions or rashes Neuro:sensation intact and strength 5/5 with no deficits, no atrophy, normal muscle tone Psych: No signs of anxiety, depression or other mood disorder  Left knee: No swelling No deformity Neg fluid wave, joint milking ROM Flex 110, Ext 0 TTP patella NTTP over the quad tendon, medial fem condyle, lat fem condyle,  , patella tendon, tibial tuberostiy, fibular head, posterior fossa, pes anserine bursa, gerdy's tubercle, medial jt line, lateral jt line    Gait normal   General: Appears well, no acute distress, nontoxic and pleasant Neck: FROM, no pain Neuro: sensation is intact distally with no deficits, strenghth is 5/5 in elbow flexors/extenders/supinator/pronators and wrist flexors/extensors Psych: no evidence of anxiety or depression  Bilateral elbow: No deformity, swelling or muscle wasting Normal Carrying angle ROM:0-140, supination and pronation 90 TTP left olecranon NTTP over triceps, ticeps tendon, olecranon, lat epicondyle, medial epicondyle, antecubital  fossa, biceps tendon, supinator, pronator Negative tinnels over cubital tunnel   pain with resisted wrist and middle digit extension on right No pain with resisted wrist flexion   pain with resisted supination on right No pain with resisted pronation Negative valgus stress Negative varus stress Negative milking maneuver   Electronically signed by:  Odis Mace D.[facility] 4:01 PM 08/26/2024

## 2024-08-26 ENCOUNTER — Ambulatory Visit

## 2024-08-26 ENCOUNTER — Ambulatory Visit: Admitting: Sports Medicine

## 2024-08-26 ENCOUNTER — Other Ambulatory Visit (HOSPITAL_BASED_OUTPATIENT_CLINIC_OR_DEPARTMENT_OTHER): Payer: Self-pay

## 2024-08-26 VITALS — HR 81 | Ht 68.0 in | Wt 168.0 lb

## 2024-08-26 DIAGNOSIS — M7042 Prepatellar bursitis, left knee: Secondary | ICD-10-CM | POA: Diagnosis not present

## 2024-08-26 DIAGNOSIS — M25562 Pain in left knee: Secondary | ICD-10-CM

## 2024-08-26 DIAGNOSIS — M7022 Olecranon bursitis, left elbow: Secondary | ICD-10-CM

## 2024-08-26 DIAGNOSIS — M7711 Lateral epicondylitis, right elbow: Secondary | ICD-10-CM

## 2024-08-26 DIAGNOSIS — G8929 Other chronic pain: Secondary | ICD-10-CM

## 2024-08-26 DIAGNOSIS — M25521 Pain in right elbow: Secondary | ICD-10-CM

## 2024-08-26 MED ORDER — MELOXICAM 15 MG PO TABS
ORAL_TABLET | ORAL | 0 refills | Status: DC
  Filled 2024-08-26: qty 30, 30d supply, fill #0

## 2024-08-26 NOTE — Patient Instructions (Signed)
-   Start meloxicam 15 mg daily x2 weeks.  If still having pain after 2 weeks, complete 3rd-week of NSAID. May use remaining NSAID as needed once daily for pain control.  Do not to use additional over-the-counter NSAIDs (ibuprofen, naproxen, Advil, Aleve, etc.) while taking prescription NSAIDs.  May use Tylenol  [phone] mg 2 to 3 times a day for breakthrough pain.  Elbow HEP   Recommend getting elbow and knee pads for work   6 week follow up

## 2024-09-06 ENCOUNTER — Ambulatory Visit: Payer: Self-pay | Admitting: Sports Medicine

## 2024-09-18 ENCOUNTER — Other Ambulatory Visit (HOSPITAL_BASED_OUTPATIENT_CLINIC_OR_DEPARTMENT_OTHER): Payer: Self-pay

## 2024-09-20 NOTE — Progress Notes (Signed)
 " [facility] Pharmacist Note  Name: Aquarius Latouche Conerly  MRN: [id number]  DOB: [birth year]  Sex: male PCP: Johnny Garnette LABOR, MD CPP Referral Provider: Dr. Serene  HISTORY OF PRESENT ILLNESS: Tryston Gilliam Gange is a 69 y.o. male with PMH atherosclerosis of native artery of both lower extremities with intermittent claudication, asymptomatic carotid artery stenosis, and abdominal aortic aneurysm who presents for medication management for cardiovascular risk reduction.   Dyslipidemia/ASCVD  Current lipid-lowering medications: atorvastatin  10 mg daily - started taking a few months ago Previously tried medications/intolerances: none  Current antiplatelets/antithrombotics: aspirin 81 mg daily  Rx affordability and access: [employer]   Current physical activity: active job, works as a nutritional therapist  Patient is not up to date on annual influenza vaccine.  Patient is not up to date on COVID vaccines.   Past Medical History:  Diagnosis Date   Urinary incontinence    Past Surgical History:  Procedure Laterality Date   COLONOSCOPY  05/19/2019   per Dr. Rollin, benign polyps, repeat in 10 yrs    left inguinal hernia repair and umbilical hernia repiar     Dr. Mikell   TONSILLECTOMY     VASECTOMY     Dr. Mardy   No family history on file. LABS: Lab Results  Component Value Date   CHOL 158 04/20/2024   HDL 39.80 04/20/2024   LDLCALC 108 (H) 04/20/2024   LDLDIRECT 155.6 01/23/2011   TRIG 52.0 04/20/2024   CHOLHDL 4 04/20/2024    Lab Results  Component Value Date   CREATININE 0.74 04/09/2024   BUN 16 04/09/2024   NA 136 04/09/2024   K 4.0 04/09/2024   CL 104 04/09/2024   CO2 22 04/09/2024   CrCl cannot be calculated (Patient's most recent lab result is older than the maximum 21 days allowed.).      Component Value Date/Time   PROT 7.3 04/20/2024 0856   ALBUMIN 3.5 04/20/2024 0856   AST 31 04/20/2024 0856   ALT 54 (H) 04/20/2024 0856   ALKPHOS 126 (H) 04/20/2024 0856   BILITOT 0.3 04/20/2024  0856   BILIDIR 0.1 04/20/2024 0856   IBILI 0.3 04/07/2024 0710    Lab Results  Component Value Date   HGBA1C 5.4 04/08/2024    ASSESSMENT & PLAN:  Dyslipidemia LDL above goal <55 mg/dL. Needs updated lipid panel since he started taking atorvastatin  10 mg daily a few months ago. Will obtain lipid panel and direct LDL today as he is not currently fasting. Likely will increase atorvastatin  to high intensity. Continue atorvastatin  10 mg daily  Counseled patient on treatment, including efficacy, dosing, administration, possible adverse effects, and anticipated cost.  Reviewed long-term complications of uncontrolled cholesterol.  Reviewed goals for cholesterol readings with patient.  Reviewed dietary and lifestyle modifications to improve cholesterol.   Antiplatelets/Antithrombotics Patient with no recent revascularization.  Continue aspirin 81 mg daily.  Counseled patient on treatment, including efficacy, dosing, administration, possible adverse effects, and anticipated cost.   Elevated Blood Pressure BP above goal <130/80 mmHg. Patient reports he has never been diagnosed with hypertension, but BP is always elevated at the doctor's office. Prescription for BP monitor sent to [facility]. Will have him monitor his BP daily at home and bring a log of readings to his next appointment. Reviewed long-term outcomes of uncontrolled blood pressure.  Counseled on proper blood pressure monitoring technique and reviewed blood pressure goal.  Reviewed dietary and lifestyle modifications to improve blood pressure.   Recommend annual influenza and COVID  vaccines.   Follow up: Will follow up via phone on lipid panel results and PharmD Clinic appointment in 1 month to further discuss BP readings  Izetta Henry, PharmD [facility] [phone] "

## 2024-09-21 ENCOUNTER — Ambulatory Visit: Attending: Cardiology | Admitting: Cardiology

## 2024-09-21 ENCOUNTER — Ambulatory Visit (INDEPENDENT_AMBULATORY_CARE_PROVIDER_SITE_OTHER): Admitting: Pharmacist

## 2024-09-21 ENCOUNTER — Other Ambulatory Visit (HOSPITAL_BASED_OUTPATIENT_CLINIC_OR_DEPARTMENT_OTHER): Payer: Self-pay

## 2024-09-21 ENCOUNTER — Other Ambulatory Visit (HOSPITAL_COMMUNITY): Payer: Self-pay

## 2024-09-21 VITALS — BP 169/88 | HR 74

## 2024-09-21 VITALS — BP 152/74 | HR 66 | Ht 69.0 in | Wt 169.0 lb

## 2024-09-21 DIAGNOSIS — I700 Atherosclerosis of aorta: Secondary | ICD-10-CM

## 2024-09-21 DIAGNOSIS — I2510 Atherosclerotic heart disease of native coronary artery without angina pectoris: Secondary | ICD-10-CM | POA: Diagnosis not present

## 2024-09-21 DIAGNOSIS — I716 Thoracoabdominal aortic aneurysm, without rupture, unspecified: Secondary | ICD-10-CM | POA: Diagnosis not present

## 2024-09-21 DIAGNOSIS — J948 Other specified pleural conditions: Secondary | ICD-10-CM

## 2024-09-21 DIAGNOSIS — R072 Precordial pain: Secondary | ICD-10-CM

## 2024-09-21 DIAGNOSIS — I70213 Atherosclerosis of native arteries of extremities with intermittent claudication, bilateral legs: Secondary | ICD-10-CM

## 2024-09-21 MED ORDER — OMRON 3 SERIES BP MONITOR DEVI
0 refills | Status: AC
  Filled 2024-09-21: qty 1, 1d supply, fill #0

## 2024-09-21 NOTE — Progress Notes (Unsigned)
 " Cardiology Office Note:  .   Date:  09/22/2024  ID:  Martin Bowman, DOB August 05, 1956, MRN [id number] PCP: Johnny Garnette LABOR, MD  [facility] Providers Cardiologist:  None    History of Present Illness: .   Martin Bowman is a 69 y.o. male Discussed the use of AI scribe   History of Present Illness Martin Bowman is a 69 year old male with aortic atherosclerosis who presents for evaluation of coronary artery disease. He was referred by Dr. Dollene for evaluation of potential heart involvement due to aortic atherosclerosis.  He has experienced a new symptom of transient 'little pain' in the precordial area in the morning, which resolved on its own without any specific aggravating or alleviating factors. No consistent chest pain, shortness of breath, or palpitations.  He has a history of aortic atherosclerosis and was started on atorvastatin  10 mg after a hospital admission in August for a collapsed lung. His cholesterol levels were elevated at that time, with an LDL of 108 mg/dL. He is currently undergoing lab work to assess his cholesterol levels and determine if adjustments to his medication are necessary.  In September 2025, a CT scan without contrast showed calcification in the aorta and coronary arteries. An echocardiogram in July 2025 showed an ejection fraction of 65%.  He experienced a collapsed lung in July 2025 after lifting a heavy box, which led to further investigations and the discovery of his current cardiovascular issues. He initially thought he had pulled a muscle and delayed seeking medical attention for two days.  Family history is significant for his father having polycystic kidney disease, but no known heart problems. His mother passed away in a car accident at a young age.      Studies Reviewed: .        Results Labs LDL (04/20/2024): 108  Radiology Chest CT (05/31/2024): Aortic atherosclerosis with calcification of the aorta; coronary artery calcification; no  acute findings (Independently interpreted) Abdominal MRI (06/2024): Abdominal aortic aneurysm measuring 3 cm  Diagnostic Echocardiogram (03/2024): Normal left ventricular systolic function; ejection fraction 65%; no valvular abnormalities; no evidence of prior myocardial infarction Risk Assessment/Calculations:          Physical Exam:   VS:  BP (!) 152/74 (BP Location: Right Arm, Patient Position: Sitting, Cuff Size: Normal)   Pulse 66   Ht 5' 9 (1.753 m)   Wt 169 lb (76.7 kg)   BMI 24.96 kg/m    Wt Readings from Last 3 Encounters:  09/21/24 169 lb (76.7 kg)  08/26/24 168 lb (76.2 kg)  08/16/24 168 lb 11.2 oz (76.5 kg)    GEN: Well nourished, well developed in no acute distress NECK: No JVD; No carotid bruits CARDIAC: RRR, no murmurs, no rubs, no gallops RESPIRATORY:  Clear to auscultation without rales, wheezing or rhonchi  ABDOMEN: Soft, non-tender, non-distended EXTREMITIES:  No edema; No deformity   ASSESSMENT AND PLAN: .    Assessment and Plan Assessment & Plan Coronary artery disease Intermittent precordial chest pain with coronary artery calcification on imaging. No significant valvular issues or myocardial infarction on echocardiogram. Normal ejection fraction at 65%. - Ordered coronary CT scan to assess for coronary artery blockages. - Continue atorvastatin  10 mg daily. - Await lab results to determine if statin therapy adjustment is needed.  Aortic atherosclerosis Calcification of the aorta on imaging. No acute intervention required. - Continue monitoring with regular follow-up.  Abdominal aortic aneurysm 3.1 cm Small abdominal aortic aneurysm identified on  imaging. No acute intervention required. - Continue monitoring with regular follow-up with Dr. Serene.  Hyperlipidemia Managed with atorvastatin  10 mg daily. LDL levels previously elevated, with a target of less than 70 mg/dL. Potential switch to rosuvastatin if LDL goals are not met. - Await lab results  to assess current LDL levels. - Will consider switching to rosuvastatin if LDL goals are not met. Seen by pharmacist in vascular clinic.          Dispo: Will follow up with studies  Signed, Martin Parchment, MD  "

## 2024-09-21 NOTE — Patient Instructions (Signed)
 Medication Instructions:  The current medical regimen is effective;  continue present plan and medications.  *If you need a refill on your cardiac medications before your next appointment, please call your pharmacy*  Testing/Procedures:   Your cardiac CT will be scheduled at:   [facility] [address] [place], [place] [address] [phone] (Severe contrast allergies only)  [facility] [address]  [place], [place] [address]  Please enter the parking lot using the [address] entrance and use the FREE valet service at the patient drop-off area. Enter the building and check-in with registration on the main floor.  Please follow these instructions carefully (unless otherwise directed):  An IV will be required for this test and Nitroglycerin will be given.  Hold all erectile dysfunction medications at least 3 days (72 hrs) prior to test. (Ie viagra, cialis, sildenafil, tadalafil, etc)   On the Night Before the Test: Be sure to Drink plenty of water. Do not consume any caffeinated/decaffeinated beverages or chocolate 12 hours prior to your test. Do not take any antihistamines 12 hours prior to your test.  On the Day of the Test: Drink plenty of water until 1 hour prior to the test. Do not eat any food 1 hour prior to test. You may take your regular medications prior to the test.  Take metoprolol (Lopressor) two hours prior to test. If you take Furosemide/Hydrochlorothiazide/Spironolactone/Chlorthalidone, please HOLD on the morning of the test. Patients who wear a continuous glucose monitor MUST remove the device prior to scanning.  After the Test: Drink plenty of water. After receiving IV contrast, you may experience a mild flushed feeling. This is normal. On occasion, you may experience a mild Hillery up to 24 hours after the test. This is not dangerous. If this occurs, you can take Benadryl 25 mg, Zyrtec, Claritin, or Allegra  and increase your fluid intake. (Patients taking Tikosyn should avoid Benadryl, and may take Zyrtec, Claritin, or Allegra) If you experience trouble breathing, this can be serious. If it is severe call 911 IMMEDIATELY. If it is mild, please call our office.  We will call to schedule your test 2-4 weeks out understanding that some insurance companies will need an authorization prior to the service being performed.   For more information and frequently asked questions, please visit our website : [email or web address]  For non-scheduling related questions, please contact the cardiac imaging nurse navigator should you have any questions/concerns: Cardiac Imaging Nurse Navigators Direct Office Dial: [phone]   For scheduling needs, including cancellations and rescheduling, please call Brittany, [phone].   Follow-Up: At [facility], you and your health needs are our priority.  As part of our continuing mission to provide you with exceptional heart care, our providers are all part of one team.  This team includes your primary Cardiologist (physician) and Advanced Practice Providers or APPs (Physician Assistants and Nurse Practitioners) who all work together to provide you with the care you need, when you need it.  Your next appointment:   Follow up will be based on the results of the above testing.  We recommend signing up for the patient portal called MyChart.  Sign up information is provided on this After Visit Summary.  MyChart is used to connect with patients for Virtual Visits (Telemedicine).  Patients are able to view lab/test results, encounter notes, upcoming appointments, etc.  Non-urgent messages can be sent to your provider as well.   To learn more about what you can  do with MyChart, go to [email or web address].

## 2024-09-21 NOTE — Patient Instructions (Addendum)
-   We will check your cholesterol today and I will call you with the results. We will likely need to increase the dose of your atorvastatin . Your goal LDL is less than 55. - Please monitor your BP at home and bring a log of readings to your next appointment. Your goal blood pressure is <130/80. - Continue taking aspirin 81 mg daily

## 2024-09-22 ENCOUNTER — Ambulatory Visit: Payer: Self-pay | Admitting: Pharmacist

## 2024-09-22 ENCOUNTER — Encounter: Payer: Self-pay | Admitting: Pharmacist

## 2024-09-22 ENCOUNTER — Other Ambulatory Visit (HOSPITAL_BASED_OUTPATIENT_CLINIC_OR_DEPARTMENT_OTHER): Payer: Self-pay

## 2024-09-22 LAB — LDL CHOLESTEROL, DIRECT: LDL Direct: 75 mg/dL (ref 0–99)

## 2024-09-22 MED ORDER — ATORVASTATIN CALCIUM 40 MG PO TABS
40.0000 mg | ORAL_TABLET | Freq: Every day | ORAL | 3 refills | Status: AC
  Filled 2024-09-22: qty 90, 90d supply, fill #0

## 2024-09-22 NOTE — Telephone Encounter (Signed)
 This encounter was created in error - please disregard.

## 2024-10-05 ENCOUNTER — Encounter (HOSPITAL_COMMUNITY): Payer: Self-pay

## 2024-10-07 ENCOUNTER — Ambulatory Visit (HOSPITAL_COMMUNITY)
Admission: RE | Admit: 2024-10-07 | Discharge: 2024-10-07 | Disposition: A | Discharge status: Home / Self Care | Source: Ambulatory Visit | Attending: Cardiology | Admitting: Cardiology

## 2024-10-07 DIAGNOSIS — R072 Precordial pain: Secondary | ICD-10-CM | POA: Diagnosis present

## 2024-10-07 MED ORDER — METOPROLOL TARTRATE 5 MG/5ML IV SOLN
10.0000 mg | INTRAVENOUS | Status: DC | PRN
  Administered 2024-10-07: 10 mg via INTRAVENOUS

## 2024-10-07 MED ORDER — DILTIAZEM HCL 25 MG/5ML IV SOLN: 10.0000 mg | INTRAVENOUS | Status: DC | PRN

## 2024-10-07 MED ORDER — NITROGLYCERIN 0.4 MG SL SUBL
0.8000 mg | SUBLINGUAL_TABLET | Freq: Once | SUBLINGUAL | Status: AC
  Administered 2024-10-07: 0.8 mg via SUBLINGUAL

## 2024-10-07 MED ORDER — IOHEXOL 350 MG/ML SOLN
100.0000 mL | Freq: Once | INTRAVENOUS | Status: AC | PRN
  Administered 2024-10-07: 100 mL via INTRAVENOUS

## 2024-10-09 ENCOUNTER — Ambulatory Visit: Payer: Self-pay | Admitting: Cardiology

## 2024-10-09 ENCOUNTER — Other Ambulatory Visit (HOSPITAL_COMMUNITY): Payer: Self-pay

## 2024-10-09 DIAGNOSIS — E7800 Pure hypercholesterolemia, unspecified: Secondary | ICD-10-CM

## 2024-10-09 MED ORDER — EZETIMIBE 10 MG PO TABS
10.0000 mg | ORAL_TABLET | Freq: Every day | ORAL | 3 refills | Status: AC
  Filled 2024-10-09 – 2024-10-14 (×2): qty 90, 90d supply, fill #0

## 2024-10-14 ENCOUNTER — Other Ambulatory Visit (HOSPITAL_BASED_OUTPATIENT_CLINIC_OR_DEPARTMENT_OTHER): Payer: Self-pay

## 2024-10-14 ENCOUNTER — Other Ambulatory Visit (HOSPITAL_COMMUNITY): Payer: Self-pay

## 2024-10-21 ENCOUNTER — Ambulatory Visit: Admitting: Pharmacist

## 2024-10-28 ENCOUNTER — Ambulatory Visit: Admitting: Pharmacist

## 2025-02-21 ENCOUNTER — Ambulatory Visit (HOSPITAL_COMMUNITY)

## 2025-02-21 ENCOUNTER — Ambulatory Visit: Admitting: Surgery
